# Patient Record
Sex: Female | Born: 1985 | Race: Black or African American | Hispanic: No | Marital: Single | Smoking: Never smoker
Health system: Southern US, Community
[De-identification: ages and names within clinical notes are randomized; demographics above are authoritative.]

## PROBLEM LIST (undated history)

## (undated) ENCOUNTER — Inpatient Hospital Stay: Payer: Self-pay

## (undated) DIAGNOSIS — J45909 Unspecified asthma, uncomplicated: Secondary | ICD-10-CM

## (undated) DIAGNOSIS — Z98891 History of uterine scar from previous surgery: Secondary | ICD-10-CM

## (undated) DIAGNOSIS — E119 Type 2 diabetes mellitus without complications: Secondary | ICD-10-CM

## (undated) DIAGNOSIS — I10 Essential (primary) hypertension: Secondary | ICD-10-CM

## (undated) DIAGNOSIS — K219 Gastro-esophageal reflux disease without esophagitis: Secondary | ICD-10-CM

## (undated) HISTORY — PX: APPENDECTOMY: SHX54

---

## 2005-01-08 ENCOUNTER — Emergency Department: Payer: Self-pay | Admitting: Emergency Medicine

## 2005-10-23 ENCOUNTER — Observation Stay: Payer: Self-pay

## 2005-10-24 ENCOUNTER — Inpatient Hospital Stay: Payer: Self-pay | Admitting: Obstetrics & Gynecology

## 2007-10-03 ENCOUNTER — Emergency Department: Payer: Self-pay | Admitting: Emergency Medicine

## 2008-07-20 ENCOUNTER — Emergency Department: Payer: Self-pay | Admitting: Emergency Medicine

## 2009-11-01 ENCOUNTER — Ambulatory Visit: Payer: Self-pay

## 2010-08-01 ENCOUNTER — Emergency Department: Payer: Self-pay | Admitting: Emergency Medicine

## 2011-11-21 IMAGING — CR DG KNEE COMPLETE 4+V*R*
1 series · 4 of 4 positions shown · non-contrast
Comparison: none

REASON FOR EXAM: fall / pain
COMMENTS:   Bedside (portable):N

PROCEDURE:     DXR - DXR KNEE RT COMP WITH OBLIQUES  - August 01, 2010 [DATE]
RESULT:     No fracture, dislocation or other acute bony abnormality is
identified. The knee joint space is well maintained. The patella is intact.

[Series 1: view not recorded · 0.17mm/px · 4 of 4 slices shown]
[im 1/4]
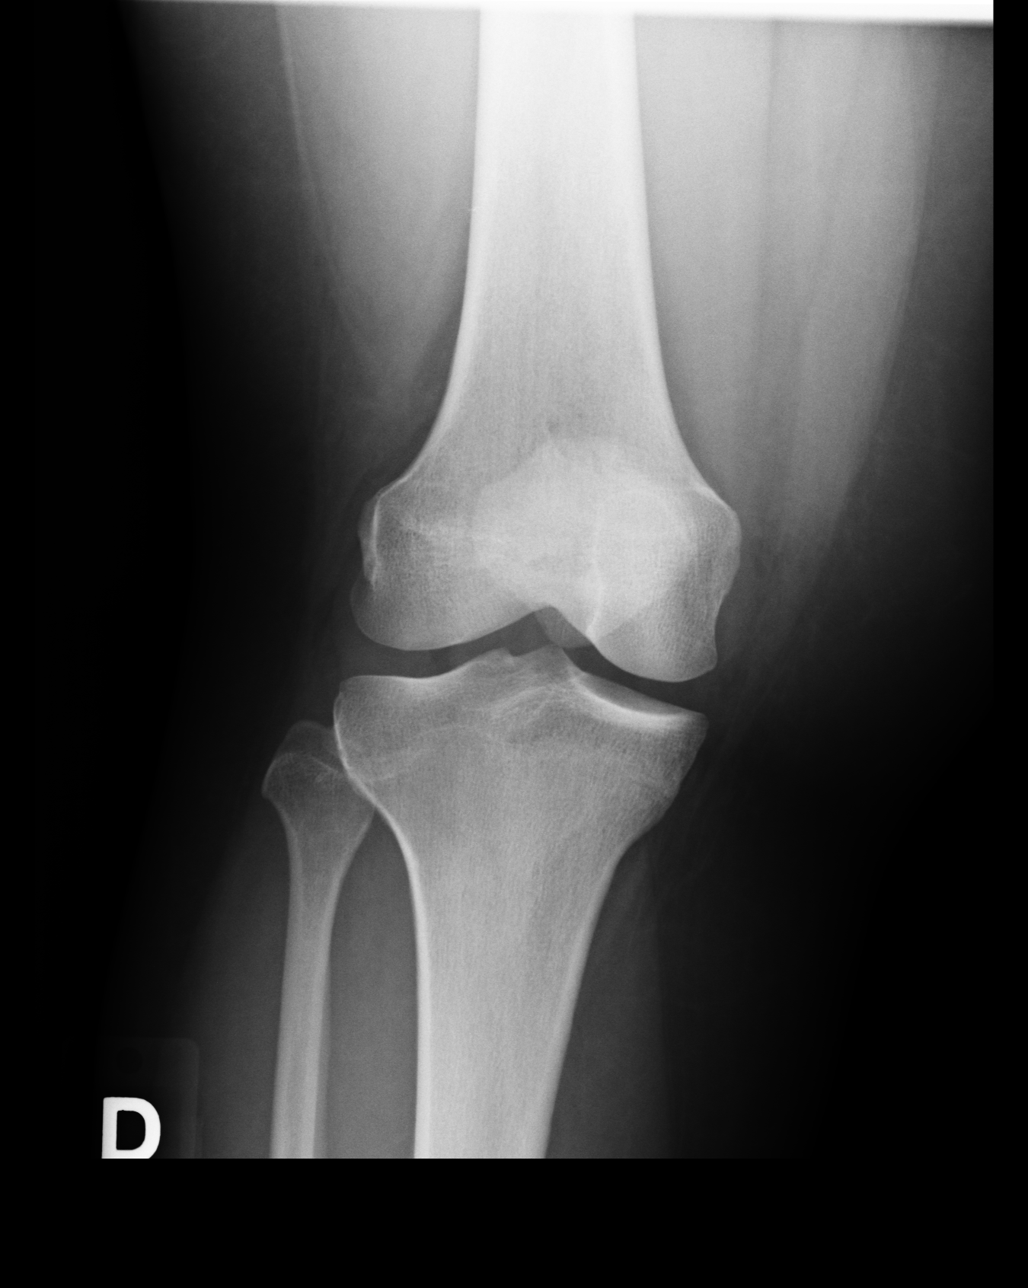
[im 2/4]
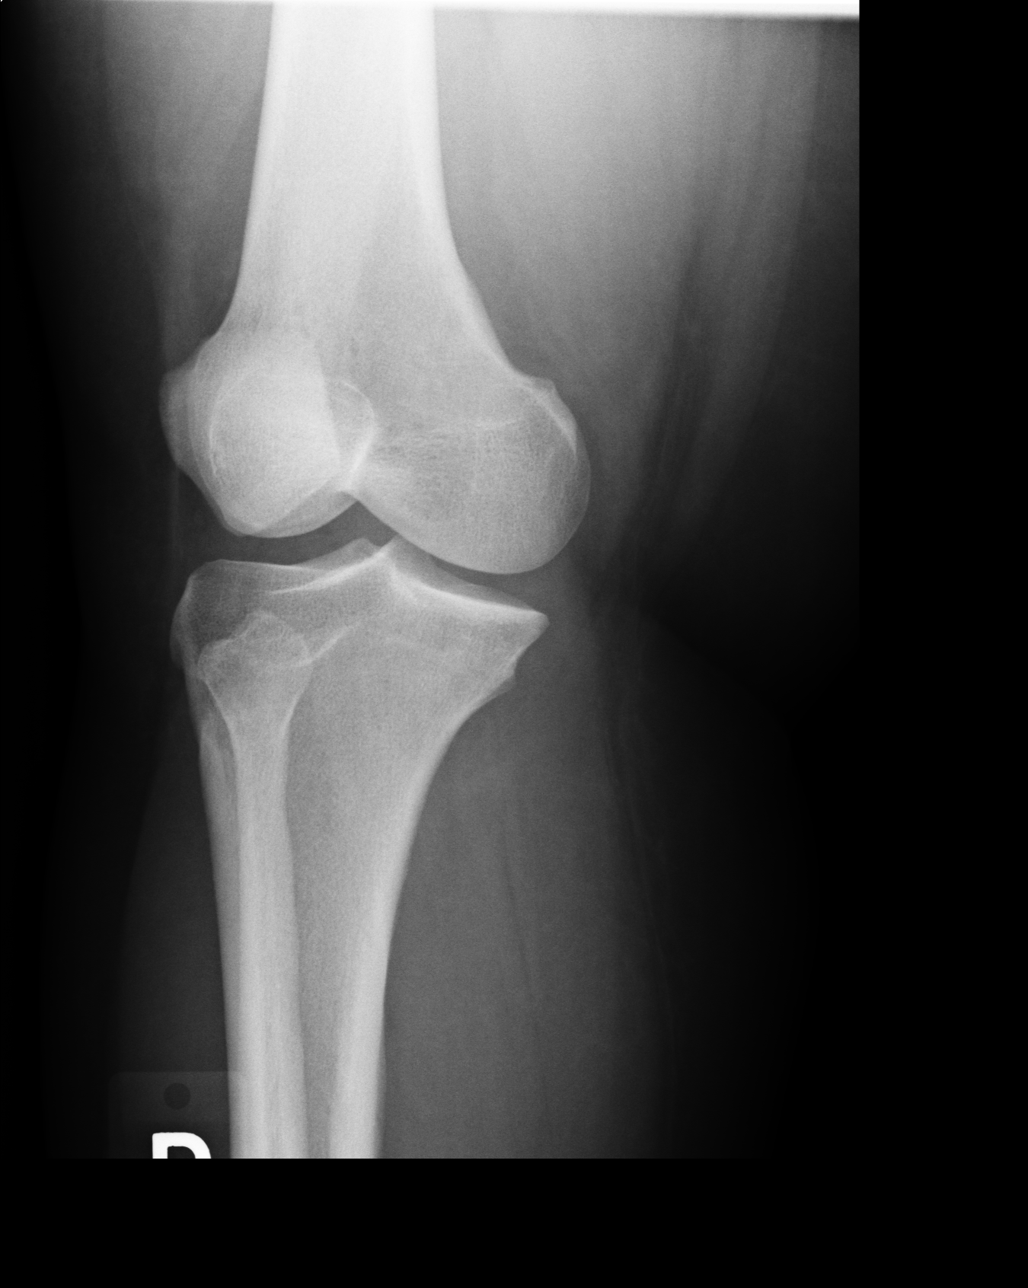
[im 3/4]
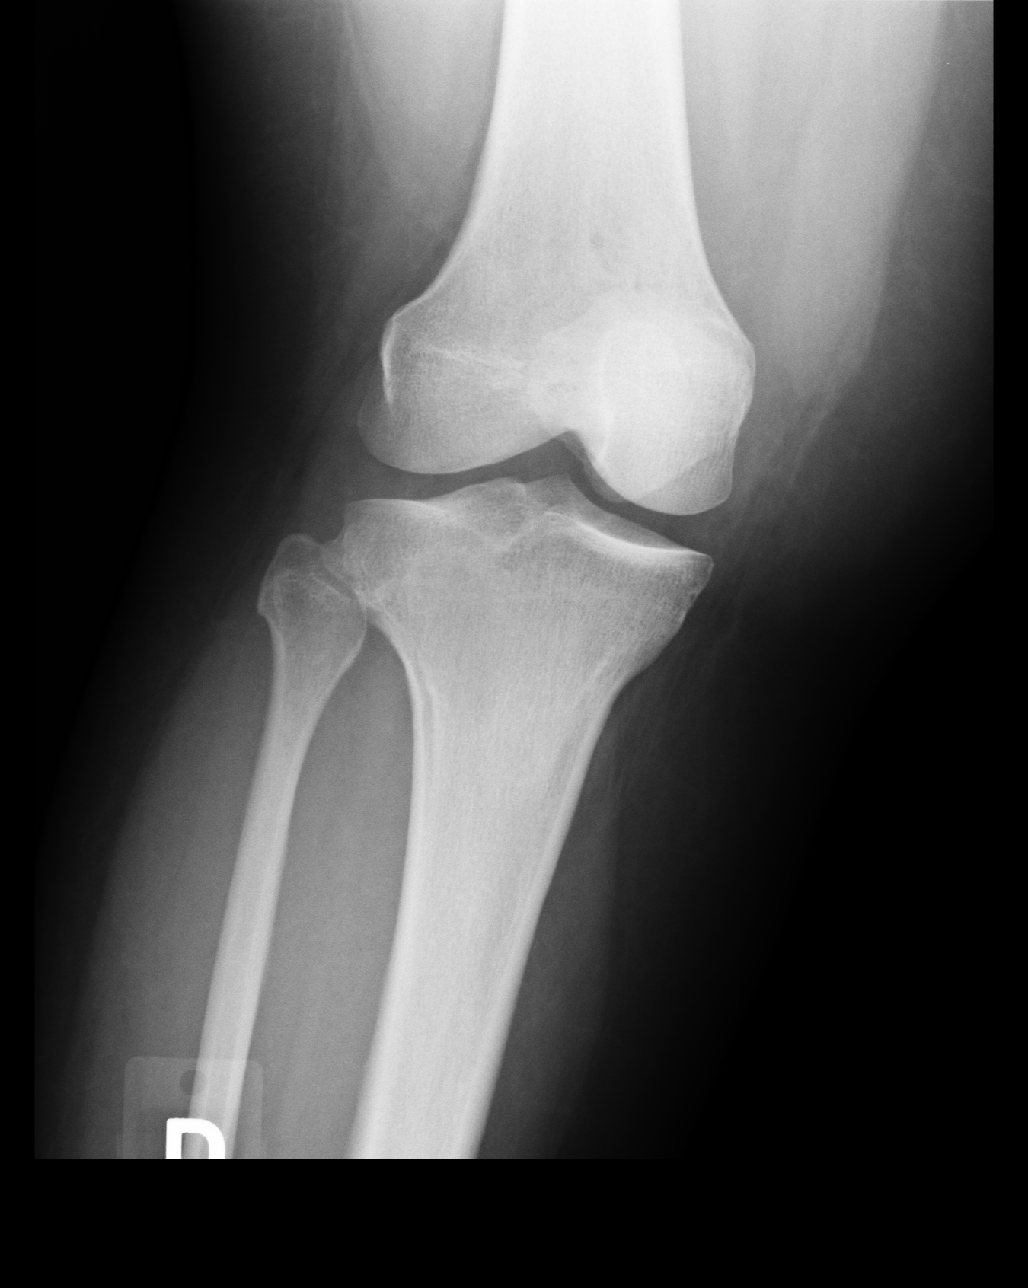
[im 4/4]
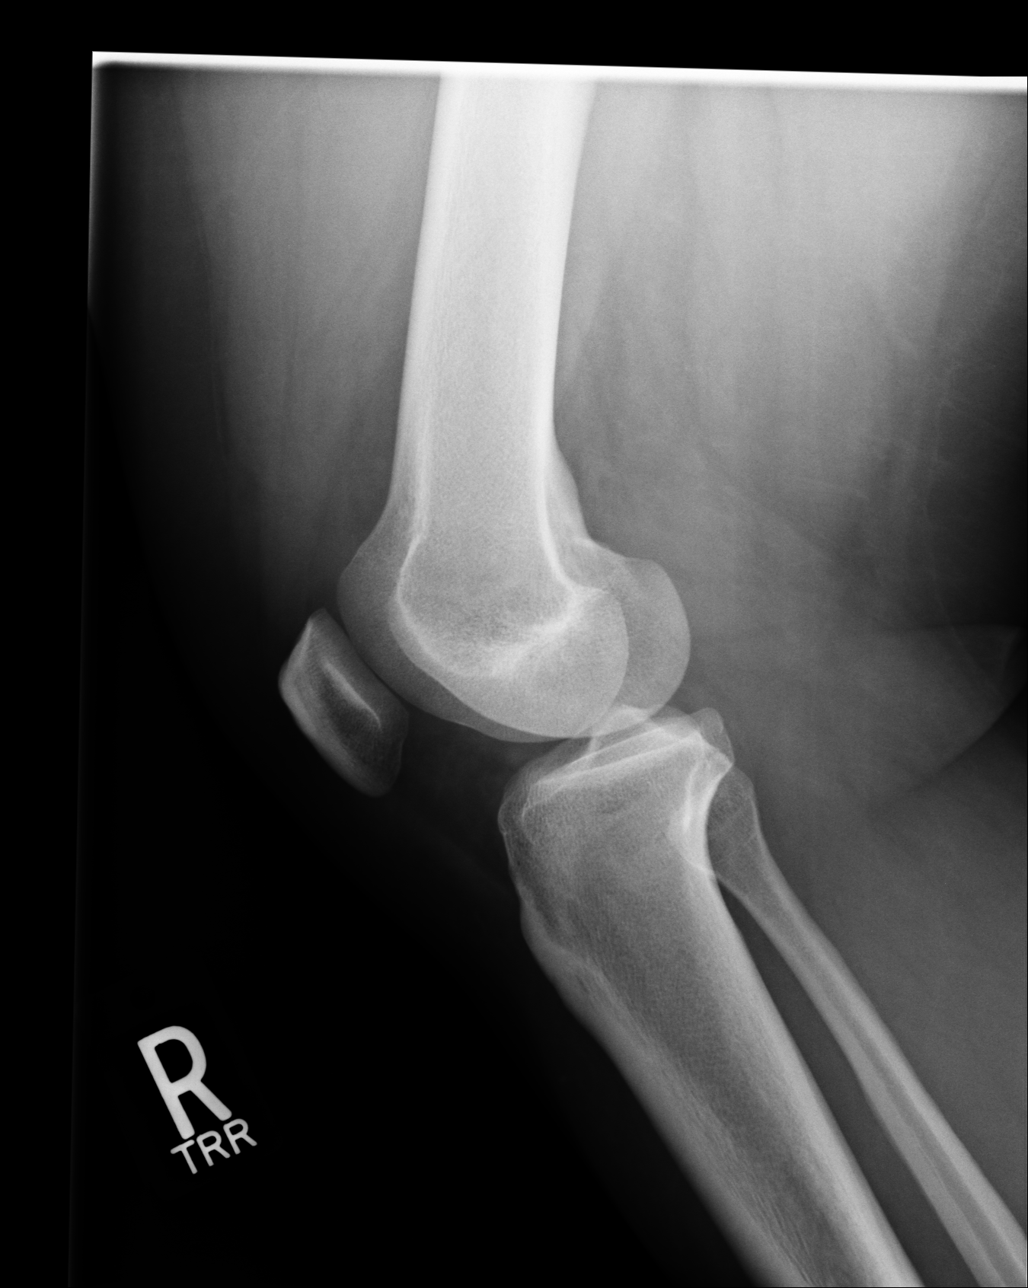

[4 of 4 positions shown; findings below may reference images not displayed]

IMPRESSION: 1.     No acute changes are identified.

## 2013-07-21 ENCOUNTER — Ambulatory Visit: Payer: Self-pay | Admitting: Emergency Medicine

## 2014-11-17 ENCOUNTER — Emergency Department: Payer: Self-pay | Admitting: Emergency Medicine

## 2015-05-24 LAB — OB RESULTS CONSOLE VARICELLA ZOSTER ANTIBODY, IGG: Varicella: IMMUNE

## 2015-05-24 LAB — OB RESULTS CONSOLE HEPATITIS B SURFACE ANTIGEN: HEP B S AG: NEGATIVE

## 2015-05-24 LAB — OB RESULTS CONSOLE RPR: RPR: NONREACTIVE

## 2015-05-24 LAB — OB RESULTS CONSOLE RUBELLA ANTIBODY, IGM: RUBELLA: IMMUNE

## 2015-05-24 LAB — OB RESULTS CONSOLE HIV ANTIBODY (ROUTINE TESTING): HIV: NONREACTIVE

## 2015-12-11 LAB — OB RESULTS CONSOLE GBS: STREP GROUP B AG: NEGATIVE

## 2015-12-12 ENCOUNTER — Observation Stay
Admission: EM | Admit: 2015-12-12 | Discharge: 2015-12-12 | Disposition: A | Discharge status: Home / Self Care | Payer: BC Managed Care – PPO | Attending: Obstetrics & Gynecology | Admitting: Obstetrics & Gynecology

## 2015-12-12 DIAGNOSIS — Z880 Allergy status to penicillin: Secondary | ICD-10-CM | POA: Insufficient documentation

## 2015-12-12 DIAGNOSIS — O34219 Maternal care for unspecified type scar from previous cesarean delivery: Secondary | ICD-10-CM | POA: Diagnosis not present

## 2015-12-12 DIAGNOSIS — O10919 Unspecified pre-existing hypertension complicating pregnancy, unspecified trimester: Secondary | ICD-10-CM | POA: Insufficient documentation

## 2015-12-12 DIAGNOSIS — O479 False labor, unspecified: Secondary | ICD-10-CM | POA: Diagnosis present

## 2015-12-12 DIAGNOSIS — Z98891 History of uterine scar from previous surgery: Secondary | ICD-10-CM

## 2015-12-12 DIAGNOSIS — Z3A00 Weeks of gestation of pregnancy not specified: Secondary | ICD-10-CM | POA: Diagnosis not present

## 2015-12-12 HISTORY — DX: Essential (primary) hypertension: I10

## 2015-12-12 HISTORY — DX: History of uterine scar from previous surgery: Z98.891

## 2015-12-12 MED ORDER — ACETAMINOPHEN 325 MG PO TABS: 650.0000 mg | ORAL_TABLET | ORAL | Status: DC | PRN

## 2015-12-12 MED ORDER — ONDANSETRON HCL 4 MG/2ML IJ SOLN: 4.0000 mg | Freq: Four times a day (QID) | INTRAMUSCULAR | Status: DC | PRN

## 2015-12-12 NOTE — Discharge Instructions (Signed)

## 2015-12-12 NOTE — Final Progress Note (Signed)
Physician Final Progress Note  Patient ID: Julie Patterson MRN: [id number] DOB/AGE: Dec 06, 1985 30 y.o.  Admit date: 12/12/2015 Admitting provider: Gae Dry, MD Discharge date: 12/12/2015   Admission Diagnoses: Low Back Pain  Discharge Diagnoses:  Principal Problem:   History of cesarean delivery    Consults: None  Significant Findings/ Diagnostic Studies: No s/sx PTL  Procedures: A NST procedure was performed with FHR monitoring and a normal baseline established, appropriate time of 20-40 minutes of evaluation, and accels >2 seen w 15x15 characteristics.  Results show a REACTIVE NST.   Discharge Condition: good  Disposition: Home, f/u one week office  Diet: Regular diet  Discharge Activity: Activity as tolerated     Medication List    ASK your doctor about these medications        aspirin 81 MG tablet  Take 81 mg by mouth daily.     labetalol 100 MG tablet  Commonly known as:  NORMODYNE  Take 100 mg by mouth 2 (two) times daily.     prenatal vitamin w/FE, FA 27-1 MG Tabs tablet  Take 1 tablet by mouth daily at 12 noon.         Total time spent taking care of this patient: 15 minutes  Signed: HARRIS,ROBERT PAUL 12/12/2015, 10:40 PM

## 2015-12-12 NOTE — OB Triage Note (Signed)
Patient presented to L&D complaining of contractions that started at 1700 every 7-10 minutes.  Patient denies vaginal bleeding, leaking of fluid or decreased fetal movement, previous C-section delivery.  Patient states she has a history of chronic hypertension and takes labetolol,

## 2015-12-25 ENCOUNTER — Encounter: Payer: Self-pay | Admitting: *Deleted

## 2015-12-25 ENCOUNTER — Inpatient Hospital Stay
Admission: EM | Admit: 2015-12-25 | Discharge: 2015-12-25 | Disposition: A | Discharge status: Home / Self Care | Payer: BC Managed Care – PPO | Attending: Obstetrics and Gynecology | Admitting: Obstetrics and Gynecology

## 2015-12-25 DIAGNOSIS — O368130 Decreased fetal movements, third trimester, not applicable or unspecified: Secondary | ICD-10-CM | POA: Diagnosis present

## 2015-12-25 DIAGNOSIS — Z98891 History of uterine scar from previous surgery: Secondary | ICD-10-CM

## 2015-12-25 DIAGNOSIS — Z3A38 38 weeks gestation of pregnancy: Secondary | ICD-10-CM | POA: Insufficient documentation

## 2015-12-25 NOTE — OB Triage Note (Signed)
Recvd to OBS1 with C/O decreased fetal movement during an office visit today.  To bed and efm applied.  Oriented to room and plan of care discussed.  Verbalized understanding, agrees with POC.

## 2015-12-25 NOTE — Discharge Summary (Signed)
NST note: Pt presented from office for prolonged NST after non-reactive NST at office today.   NST category 1 with baseline130, mod variability, + accels, no decels. Rare ctx  VSS  Discharge home. Pt has [place] scheduled on 2/1 for growth.

## 2016-01-01 ENCOUNTER — Encounter
Admission: RE | Admit: 2016-01-01 | Discharge: 2016-01-01 | Disposition: A | Discharge status: Home / Self Care | Payer: BC Managed Care – PPO | Source: Ambulatory Visit | Attending: Obstetrics and Gynecology | Admitting: Obstetrics and Gynecology

## 2016-01-01 HISTORY — DX: Gastro-esophageal reflux disease without esophagitis: K21.9

## 2016-01-01 HISTORY — DX: Unspecified asthma, uncomplicated: J45.909

## 2016-01-01 HISTORY — DX: Type 2 diabetes mellitus without complications: E11.9

## 2016-01-01 LAB — CBC
HCT: 37.1 % (ref 35.0–47.0)
Hemoglobin: 12.4 g/dL (ref 12.0–16.0)
MCH: 27.4 pg (ref 26.0–34.0)
MCHC: 33.4 g/dL (ref 32.0–36.0)
MCV: 81.9 fL (ref 80.0–100.0)
PLATELETS: 226 10*3/uL (ref 150–440)
RBC: 4.53 MIL/uL (ref 3.80–5.20)
RDW: 14.1 % (ref 11.5–14.5)
WBC: 7.7 10*3/uL (ref 3.6–11.0)

## 2016-01-01 LAB — TYPE AND SCREEN
ABO/RH(D): O POS
Antibody Screen: NEGATIVE
Extend sample reason: UNDETERMINED

## 2016-01-01 LAB — RAPID HIV SCREEN (HIV 1/2 AB+AG)
HIV 1/2 Antibodies: NONREACTIVE
HIV-1 P24 ANTIGEN - HIV24: NONREACTIVE

## 2016-01-02 ENCOUNTER — Inpatient Hospital Stay
Admission: RE | Admit: 2016-01-02 | Discharge: 2016-01-04 | DRG: 765 | Disposition: A | Discharge status: Home / Self Care | Payer: BC Managed Care – PPO | Source: Ambulatory Visit | Attending: Obstetrics and Gynecology | Admitting: Obstetrics and Gynecology

## 2016-01-02 ENCOUNTER — Inpatient Hospital Stay: Payer: BC Managed Care – PPO | Admitting: Anesthesiology

## 2016-01-02 ENCOUNTER — Encounter
Admission: RE | Disposition: A | Discharge status: Home / Self Care | Payer: Self-pay | Source: Ambulatory Visit | Attending: Obstetrics and Gynecology

## 2016-01-02 ENCOUNTER — Inpatient Hospital Stay
Admission: RE | Admit: 2016-01-02 | Payer: BC Managed Care – PPO | Source: Ambulatory Visit | Admitting: Obstetrics and Gynecology

## 2016-01-02 DIAGNOSIS — O10913 Unspecified pre-existing hypertension complicating pregnancy, third trimester: Secondary | ICD-10-CM | POA: Diagnosis present

## 2016-01-02 DIAGNOSIS — Z98891 History of uterine scar from previous surgery: Secondary | ICD-10-CM

## 2016-01-02 DIAGNOSIS — O24420 Gestational diabetes mellitus in childbirth, diet controlled: Secondary | ICD-10-CM | POA: Diagnosis present

## 2016-01-02 DIAGNOSIS — O1002 Pre-existing essential hypertension complicating childbirth: Secondary | ICD-10-CM | POA: Diagnosis present

## 2016-01-02 DIAGNOSIS — O34211 Maternal care for low transverse scar from previous cesarean delivery: Principal | ICD-10-CM | POA: Diagnosis present

## 2016-01-02 DIAGNOSIS — O99214 Obesity complicating childbirth: Secondary | ICD-10-CM | POA: Diagnosis present

## 2016-01-02 DIAGNOSIS — K660 Peritoneal adhesions (postprocedural) (postinfection): Secondary | ICD-10-CM | POA: Diagnosis present

## 2016-01-02 DIAGNOSIS — E669 Obesity, unspecified: Secondary | ICD-10-CM | POA: Diagnosis present

## 2016-01-02 DIAGNOSIS — O99213 Obesity complicating pregnancy, third trimester: Secondary | ICD-10-CM | POA: Diagnosis present

## 2016-01-02 DIAGNOSIS — Z6841 Body Mass Index (BMI) 40.0 and over, adult: Secondary | ICD-10-CM | POA: Diagnosis not present

## 2016-01-02 DIAGNOSIS — Z3A39 39 weeks gestation of pregnancy: Secondary | ICD-10-CM

## 2016-01-02 HISTORY — DX: History of uterine scar from previous surgery: Z98.891

## 2016-01-02 LAB — RPR: RPR Ser Ql: NONREACTIVE

## 2016-01-02 LAB — ABO/RH: ABO/RH(D): O POS

## 2016-01-02 SURGERY — Surgical Case
Anesthesia: Spinal

## 2016-01-02 MED ORDER — NALBUPHINE HCL 10 MG/ML IJ SOLN: 5.0000 mg | INTRAMUSCULAR | Status: DC | PRN

## 2016-01-02 MED ORDER — DIPHENHYDRAMINE HCL 25 MG PO CAPS
25.0000 mg | ORAL_CAPSULE | ORAL | Status: DC | PRN
Start: 2016-01-02 — End: 2016-01-04
  Administered 2016-01-02 – 2016-01-03 (×2): 25 mg via ORAL
  Filled 2016-01-02 (×2): qty 1

## 2016-01-02 MED ORDER — DIBUCAINE 1 % RE OINT: 1.0000 "application " | TOPICAL_OINTMENT | RECTAL | Status: DC | PRN

## 2016-01-02 MED ORDER — DIPHENHYDRAMINE HCL 25 MG PO CAPS: 25.0000 mg | ORAL_CAPSULE | Freq: Four times a day (QID) | ORAL | Status: DC | PRN

## 2016-01-02 MED ORDER — DIPHENHYDRAMINE HCL 50 MG/ML IJ SOLN
12.5000 mg | INTRAMUSCULAR | Status: DC | PRN
  Administered 2016-01-02: 12.5 mg via INTRAVENOUS
  Filled 2016-01-02: qty 1

## 2016-01-02 MED ORDER — LACTATED RINGERS IV SOLN
INTRAVENOUS | Status: DC
  Administered 2016-01-02: 09:00:00 via INTRAVENOUS

## 2016-01-02 MED ORDER — GENTAMICIN SULFATE 40 MG/ML IJ SOLN
140.8500 mg | INTRAMUSCULAR | Status: DC | PRN
  Administered 2016-01-02: 140.8 mg via INTRAVENOUS

## 2016-01-02 MED ORDER — IBUPROFEN 600 MG PO TABS
600.0000 mg | ORAL_TABLET | Freq: Four times a day (QID) | ORAL | Status: DC
  Administered 2016-01-02 – 2016-01-04 (×6): 600 mg via ORAL
  Filled 2016-01-02 (×4): qty 1

## 2016-01-02 MED ORDER — SIMETHICONE 80 MG PO CHEW
80.0000 mg | CHEWABLE_TABLET | Freq: Three times a day (TID) | ORAL | Status: DC
  Administered 2016-01-03 (×3): 80 mg via ORAL
  Filled 2016-01-02 (×3): qty 1

## 2016-01-02 MED ORDER — DEXTROSE 5 % IV SOLN
1.0000 ug/kg/h | INTRAVENOUS | Status: DC | PRN
  Filled 2016-01-02: qty 2

## 2016-01-02 MED ORDER — ONDANSETRON HCL 4 MG/2ML IJ SOLN: 4.0000 mg | Freq: Three times a day (TID) | INTRAMUSCULAR | Status: DC | PRN

## 2016-01-02 MED ORDER — LACTATED RINGERS IV SOLN
2.5000 [IU]/h | INTRAVENOUS | Status: AC
  Filled 2016-01-02: qty 4

## 2016-01-02 MED ORDER — FERROUS SULFATE 325 (65 FE) MG PO TABS
325.0000 mg | ORAL_TABLET | Freq: Two times a day (BID) | ORAL | Status: DC
  Administered 2016-01-03 – 2016-01-04 (×3): 325 mg via ORAL
  Filled 2016-01-02 (×3): qty 1

## 2016-01-02 MED ORDER — LANOLIN HYDROUS EX OINT: 1.0000 "application " | TOPICAL_OINTMENT | CUTANEOUS | Status: DC | PRN

## 2016-01-02 MED ORDER — ONDANSETRON HCL 4 MG/2ML IJ SOLN
4.0000 mg | Freq: Once | INTRAMUSCULAR | Status: DC | PRN
Start: 2016-01-02 — End: 2016-01-02

## 2016-01-02 MED ORDER — LABETALOL HCL 100 MG PO TABS
100.0000 mg | ORAL_TABLET | Freq: Two times a day (BID) | ORAL | Status: DC
  Administered 2016-01-03 – 2016-01-04 (×2): 100 mg via ORAL
  Filled 2016-01-02 (×2): qty 1

## 2016-01-02 MED ORDER — CITRIC ACID-SODIUM CITRATE 334-500 MG/5ML PO SOLN
ORAL | Status: AC
  Administered 2016-01-02: 30 mL
  Filled 2016-01-02: qty 15

## 2016-01-02 MED ORDER — PRENATAL MULTIVITAMIN CH
1.0000 | ORAL_TABLET | Freq: Every day | ORAL | Status: DC
  Administered 2016-01-03: 1 via ORAL
  Filled 2016-01-02: qty 1

## 2016-01-02 MED ORDER — BUPIVACAINE HCL (PF) 0.5 % IJ SOLN: 5.0000 mL | Freq: Once | INTRAMUSCULAR | Status: DC

## 2016-01-02 MED ORDER — SCOPOLAMINE 1 MG/3DAYS TD PT72
MEDICATED_PATCH | TRANSDERMAL | Status: AC
  Filled 2016-01-02: qty 1

## 2016-01-02 MED ORDER — CLINDAMYCIN PHOSPHATE 900 MG/50ML IV SOLN
900.0000 mg | INTRAVENOUS | Status: AC
  Administered 2016-01-02: 900 mg via INTRAVENOUS
  Filled 2016-01-02: qty 50

## 2016-01-02 MED ORDER — FENTANYL CITRATE (PF) 100 MCG/2ML IJ SOLN
25.0000 ug | INTRAMUSCULAR | Status: DC | PRN
  Administered 2016-01-02 (×2): 25 ug via INTRAVENOUS
  Filled 2016-01-02: qty 2

## 2016-01-02 MED ORDER — SODIUM CHLORIDE 0.9% FLUSH: 3.0000 mL | INTRAVENOUS | Status: DC | PRN

## 2016-01-02 MED ORDER — BUPIVACAINE IN DEXTROSE 0.75-8.25 % IT SOLN
INTRATHECAL | Status: DC | PRN
  Administered 2016-01-02: 1.5 mL via INTRATHECAL

## 2016-01-02 MED ORDER — NALBUPHINE HCL 10 MG/ML IJ SOLN: 5.0000 mg | Freq: Once | INTRAMUSCULAR | Status: AC | PRN

## 2016-01-02 MED ORDER — LACTATED RINGERS IV SOLN
INTRAVENOUS | Status: DC
  Administered 2016-01-02: 12:00:00 via INTRAVENOUS

## 2016-01-02 MED ORDER — MENTHOL 3 MG MT LOZG
1.0000 | LOZENGE | OROMUCOSAL | Status: DC | PRN
Start: 2016-01-02 — End: 2016-01-04

## 2016-01-02 MED ORDER — OXYTOCIN 10 UNIT/ML IJ SOLN
40.0000 [IU] | INTRAVENOUS | Status: DC | PRN
  Administered 2016-01-02: 40 [IU] via INTRAVENOUS

## 2016-01-02 MED ORDER — SCOPOLAMINE 1 MG/3DAYS TD PT72
1.0000 | MEDICATED_PATCH | Freq: Once | TRANSDERMAL | Status: DC
  Administered 2016-01-02: 1.5 mg via TRANSDERMAL

## 2016-01-02 MED ORDER — IBUPROFEN 600 MG PO TABS
600.0000 mg | ORAL_TABLET | Freq: Four times a day (QID) | ORAL | Status: DC | PRN
  Administered 2016-01-02: 600 mg via ORAL
  Filled 2016-01-02 (×3): qty 1

## 2016-01-02 MED ORDER — NALBUPHINE HCL 10 MG/ML IJ SOLN
5.0000 mg | Freq: Once | INTRAMUSCULAR | Status: AC | PRN
  Administered 2016-01-02: 5 mg via INTRAVENOUS
  Filled 2016-01-02: qty 1

## 2016-01-02 MED ORDER — SENNOSIDES-DOCUSATE SODIUM 8.6-50 MG PO TABS
2.0000 | ORAL_TABLET | ORAL | Status: DC
  Administered 2016-01-02 – 2016-01-03 (×2): 2 via ORAL
  Filled 2016-01-02 (×2): qty 2

## 2016-01-02 MED ORDER — NALOXONE HCL 0.4 MG/ML IJ SOLN: 0.4000 mg | INTRAMUSCULAR | Status: DC | PRN

## 2016-01-02 MED ORDER — MORPHINE SULFATE (PF) 0.5 MG/ML IJ SOLN
INTRAMUSCULAR | Status: DC | PRN
  Administered 2016-01-02: .2 mg via EPIDURAL

## 2016-01-02 MED ORDER — LACTATED RINGERS IV SOLN
Freq: Once | INTRAVENOUS | Status: AC
  Administered 2016-01-02: 08:00:00 via INTRAVENOUS

## 2016-01-02 MED ORDER — BUPIVACAINE HCL (PF) 0.5 % IJ SOLN
5.0000 mL | Freq: Once | INTRAMUSCULAR | Status: DC
  Filled 2016-01-02: qty 30

## 2016-01-02 MED ORDER — WITCH HAZEL-GLYCERIN EX PADS: 1.0000 "application " | MEDICATED_PAD | CUTANEOUS | Status: DC | PRN

## 2016-01-02 MED ORDER — PHENYLEPHRINE HCL 10 MG/ML IJ SOLN
INTRAMUSCULAR | Status: DC | PRN
  Administered 2016-01-02 (×5): 100 ug via INTRAVENOUS

## 2016-01-02 MED ORDER — DEXTROSE 5 % IV SOLN
140.0000 mg | INTRAVENOUS | Status: DC
  Filled 2016-01-02: qty 3.5

## 2016-01-02 MED ORDER — BUPIVACAINE 0.25 % ON-Q PUMP DUAL CATH 400 ML
400.0000 mL | INJECTION | Status: DC
  Filled 2016-01-02: qty 400

## 2016-01-02 MED ORDER — BUPIVACAINE ON-Q PAIN PUMP (FOR ORDER SET NO CHG)
INJECTION | Status: DC
  Filled 2016-01-02: qty 1

## 2016-01-02 MED ORDER — ONDANSETRON HCL 4 MG/2ML IJ SOLN
INTRAMUSCULAR | Status: DC | PRN
  Administered 2016-01-02: 4 mg via INTRAVENOUS

## 2016-01-02 MED ORDER — MEPERIDINE HCL 25 MG/ML IJ SOLN: 6.2500 mg | INTRAMUSCULAR | Status: DC | PRN

## 2016-01-02 SURGICAL SUPPLY — 30 items
CANISTER SUCT 3000ML (MISCELLANEOUS) ×3 IMPLANT
CATH KIT ON-Q SILVERSOAK 5IN (CATHETERS) ×6 IMPLANT
CLOSURE WOUND 1/2 X4 (GAUZE/BANDAGES/DRESSINGS) ×2
DRSG OPSITE POSTOP 4X8 (GAUZE/BANDAGES/DRESSINGS) ×3 IMPLANT
DRSG TELFA 3X8 NADH (GAUZE/BANDAGES/DRESSINGS) ×3 IMPLANT
ELECT CAUTERY BLADE 6.4 (BLADE) IMPLANT
ELECT REM PT RETURN 9FT ADLT (ELECTROSURGICAL) ×3
ELECTRODE REM PT RTRN 9FT ADLT (ELECTROSURGICAL) ×1 IMPLANT
GAUZE SPONGE 4X4 12PLY STRL (GAUZE/BANDAGES/DRESSINGS) ×6 IMPLANT
GLOVE BIO SURGEON STRL SZ7 (GLOVE) ×3 IMPLANT
GLOVE INDICATOR 7.5 STRL GRN (GLOVE) ×3 IMPLANT
GOWN STRL REUS W/ TWL LRG LVL3 (GOWN DISPOSABLE) ×3 IMPLANT
GOWN STRL REUS W/TWL LRG LVL3 (GOWN DISPOSABLE) ×6
LIQUID BAND (GAUZE/BANDAGES/DRESSINGS) ×3 IMPLANT
NS IRRIG 1000ML POUR BTL (IV SOLUTION) ×3 IMPLANT
PACK C SECTION AR (MISCELLANEOUS) ×3 IMPLANT
PAD ABD DERMACEA PRESS 5X9 (GAUZE/BANDAGES/DRESSINGS) ×3 IMPLANT
PAD OB MATERNITY 4.3X12.25 (PERSONAL CARE ITEMS) ×6 IMPLANT
PAD PREP 24X41 OB/GYN DISP (PERSONAL CARE ITEMS) ×3 IMPLANT
SPONGE LAP 18X18 5 PK (GAUZE/BANDAGES/DRESSINGS) ×9 IMPLANT
STRIP CLOSURE SKIN 1/2X4 (GAUZE/BANDAGES/DRESSINGS) ×4 IMPLANT
SUT CHROMIC GUT BROWN 0 54 (SUTURE) ×1 IMPLANT
SUT CHROMIC GUT BROWN 0 54IN (SUTURE) ×3
SUT MNCRL 4-0 (SUTURE) ×2
SUT MNCRL 4-0 27XMFL (SUTURE) ×1
SUT PDS AB 1 TP1 96 (SUTURE) ×3 IMPLANT
SUT PLAIN 2 0 XLH (SUTURE) ×6 IMPLANT
SUT VIC AB 0 CT1 36 (SUTURE) ×12 IMPLANT
SUTURE MNCRL 4-0 27XMF (SUTURE) ×1 IMPLANT
SWABSTK COMLB BENZOIN TINCTURE (MISCELLANEOUS) ×3 IMPLANT

## 2016-01-02 NOTE — Discharge Summary (Signed)
OB Discharge Summary  Patient Name: Julie Patterson DOB: 09-09-1986 MRN: [id number]  Date of admission: 01/02/2016 Delivering MD: Prentice Docker, MD Date of Delivery: 01/02/2016  Date of discharge: 01/04/2016  Admitting diagnosis:  Patient Active Problem List   Diagnosis Date Noted  . Chronic hypertension in obstetric context in third trimester 01/02/2016  . Obesity affecting pregnancy in third trimester, antepartum 01/02/2016  . BMI 40.0-44.9, adult ([place]) 01/02/2016  . [redacted] weeks gestation of pregnancy 01/02/2016  . Status post cesarean section 01/02/2016  . History of cesarean delivery 12/12/2015  Supervision of high risk pregnancy  Intrauterine pregnancy: [redacted]w[redacted]d      Secondary diagnosis: Chronic Hypertension     Discharge diagnosis: Term Pregnancy Delivered and CHTN                                                                                                Post partum procedures:none  Augmentation: n/a  Complications: None  Hospital course:  Sceduled C/S   30 y.o. yo G2P2001 at [redacted]w[redacted]d was admitted to the hospital 01/02/2016 for scheduled cesarean section with the following indication:Elective Repeat.  Membrane Rupture Time/Date:   ,    Patient delivered a Viable infant.01/02/2016  Details of operation can be found in separate operative note.  Pateint had an uncomplicated postpartum course.  She is ambulating, tolerating a regular diet, passing flatus, and urinating well. Patient is discharged home in stable condition on  01/04/2016          Physical exam  Filed Vitals:   01/03/16 1420 01/03/16 1659 01/03/16 1927 01/04/16 0700  BP: 122/53 120/62 124/46 126/66  Pulse: 62 60 55 65  Temp:  98.5 F (36.9 C) 98.4 F (36.9 C) 98.7 F (37.1 C)  TempSrc:  Oral Oral Oral  Resp:  16 20 18   Height:      Weight:      SpO2:       General: alert, cooperative and no distress Lochia: appropriate Uterine Fundus: firm Incision: Healing well with no significant drainage DVT  Evaluation: No evidence of DVT seen on physical exam.  Labs: Lab Results  Component Value Date   WBC 13.1* 01/03/2016   HGB 10.9* 01/03/2016   HCT 32.6* 01/03/2016   MCV 82.4 01/03/2016   PLT 209 01/03/2016   No flowsheet data found.  Discharge instruction: per After Visit Summary.  Medications:    Medication List    TAKE these medications        aspirin 81 MG tablet  Take 81 mg by mouth daily.     labetalol 100 MG tablet  Commonly known as:  NORMODYNE  Take 100 mg by mouth 2 (two) times daily.     oxyCODONE-acetaminophen 5-325 MG tablet  Commonly known as:  PERCOCET/ROXICET  Take 1-2 tablets by mouth every 4 (four) hours as needed for severe pain.     prenatal vitamin w/FE, FA 27-1 MG Tabs tablet  Take 1 tablet by mouth daily at 12 noon.      ALSO-    Colace for stool softener for 2 weeks    Ibuprofen for pain  Diet: routine diet  Activity: Advance as tolerated. Pelvic rest for 6 weeks.   Outpatient follow up: Wednesday with Dr Glennon Mac at [facility]  Postpartum contraception: Combination OCPs Rhogam Given postpartum: no Rubella vaccine given postpartum: no Varicella vaccine given postpartum: no TDaP given antepartum or postpartum: TDaP given 10/30/15 Flu vaccine given antepartum or postpartum: given AP on 10/30/15  Newborn Data: Live born female  Birth Weight: 6 lb 14.1 oz (3120 g) APGAR: 9, 9  Baby Feeding: Bottle  Disposition:home with mother  SIGNED: Barnett Applebaum, MD

## 2016-01-02 NOTE — Transfer of Care (Signed)
Immediate Anesthesia Transfer of Care Note  Patient: Julie Patterson  Procedure(s) Performed: Procedure(s): CESAREAN SECTION (N/A)  Patient Location: Mother/Baby  Anesthesia Type:Spinal  Level of Consciousness: awake, alert , oriented and patient cooperative  Airway & Oxygen Therapy: Patient Spontanous Breathing  Post-op Assessment: Report given to RN, Post -op Vital signs reviewed and stable and Patient moving all extremities X 4  Post vital signs: Reviewed and stable  Last Vitals:  Filed Vitals:   01/02/16 0800 01/02/16 1137  BP: 130/69 94/47  Pulse: 92 61  Temp: 36.8 C 36.7 C  Resp: 16 15    Complications: No apparent anesthesia complications

## 2016-01-02 NOTE — Anesthesia Procedure Notes (Addendum)
Spinal Patient location during procedure: OR Start time: 01/02/2016 9:46 AM End time: 01/02/2016 9:58 AM Staffing Anesthesiologist: Katy Fitch K Performed by: anesthesiologist  Preanesthetic Checklist Completed: patient identified, site marked, surgical consent, pre-op evaluation, timeout performed, IV checked, risks and benefits discussed and monitors and equipment checked Spinal Block Patient position: sitting Prep: Betadine Patient monitoring: heart rate, continuous pulse ox, blood pressure and cardiac monitor Approach: midline Location: L4-5 Injection technique: single-shot Needle Needle type: Whitacre and Introducer  Needle gauge: 24 G Needle length: 9 cm Needle insertion depth: 8 cm Additional Notes Negative paresthesia. Negative blood return. Positive free-flowing CSF. Expiration date of kit checked and confirmed. Patient tolerated procedure well, without complications.    Date/Time: 01/02/2016 10:00 AM Performed by: Allean Found Pre-anesthesia Checklist: Patient identified, Emergency Drugs available, Suction available, Patient being monitored and Timeout performed Oxygen Delivery Method: Nasal cannula

## 2016-01-02 NOTE — Op Note (Signed)
Cesarean Section Procedure Note   Julie Patterson   01/02/2016   Pre-operative Diagnosis:  1) intrauterine pregnancy at [redacted]w[redacted]d  2) history of cesarean delivery, desires repeat  Post-operative Diagnosis:  1) intrauterine pregnancy at [redacted]w[redacted]d  2) history of cesarean delivery, desires repeat  Procedure: Repeat low transverse cesarean section via pfannenstiel incision with double layer uterine closure  Surgeon: Surgeon(s) and Role:    * Will Bonnet, MD - Primary    * Aletha Halim, MD - Assisting   Assistants: Franchot Heidelberg, PA-S  Anesthesia: spinal   Findings:  1) normal appearing gravid uterus, fallopian tubes, and ovaries 2) Dense adhesions of left anterior abdominal wall to left anterior and lateral uterus   Estimated Blood Loss: 1,000 mL  Total IV Fluids: 1,800 ml crystalloid  Urine Output: 200 mL clear urine at end of procedure  Specimens: None  Complications: no complications  Disposition: PACU - hemodynamically stable.   Maternal Condition: stable   Baby condition / location:  Couplet care / Skin to Skin  Procedure Details:  The patient was seen in the Holding Room. The risks, benefits, complications, treatment options, and expected outcomes were discussed with the patient. The patient concurred with the proposed plan, giving informed consent. identified as Julie Patterson and the procedure verified as C-Section Delivery. A Time Out was held and the above information confirmed.   After induction of anesthesia, the patient was draped and prepped in the usual sterile manner. A Pfannenstiel incision was made and carried down through the subcutaneous tissue to the fascia. Fascial incision was made and extended transversely. The fascia was separated from the underlying rectus tissue superiorly and inferiorly. The peritoneum was identified and entered. Peritoneal incision was extended longitudinally. Dense adhesion were noted along the left uterus to left anterior  abdominal wall.  A portion of this was lysed using electrocautery with suture application for hemostasis.  The bladder flap was not bluntly freed from the lower uterine segment. A low transverse uterine incision was made and the hysterotomy was extended with cranial-caudal tension. Delivered from cephalic presentation was a 3,120 gram Living newborn infant(s) or Female with Apgar scores of 9 at one minute and 9 at five minutes. Cord ph was not sent the umbilical cord was clamped and cut cord blood was obtained for evaluation. The placenta was removed Intact and appeared normal. The uterine outline, tubes and ovaries appeared normal. The uterine incision was closed with running locked sutures of 0 Vicryl.  A second layer of the same suture was thrown in an imbricating fashion.  Hemostasis was assured.  The uterus was retained to the abdomen and the paracolic gutters were cleared of all clots and debris.  The rectus muscles were inspected and found to be hemostatic.  The fascia was then reapproximated with running sutures of 1-0 PDS, looped. The subcutaneous tissue was reapproximated using 2-0 plain gut such that no greater than 2cm of dead space remained. The subcuticular closure was performed using 4-0 monocryl. The skin closure was reinforced using benzoin and 1/2" steri-strips.  Instrument, sponge, and needle counts were correct prior the abdominal closure and were correct at the conclusion of the case.  The patient received clindamycin 900 mg IV and gentamicin 140mg  IV prior to skin incision (within 30 minutes) for antibiotic prophylaxis. For VTE prophylaxis she was wearing SCDs throughout the case.   Signed: Will Bonnet, MD 01/02/2016 11:24 AM

## 2016-01-02 NOTE — Consult Note (Signed)
Neonatology Note:   Attendance at C-section:    I was asked by Dr. Glennon Mac to attend this repeat C/S at term. The mother is a G2P1 O pos with normal prenatal labs with chronic HTN, on labetalol, and diet-controlled GDM. ROM at delivery, fluid clear. Infant vigorous with good spontaneous cry and tone. Needed only minimal bulb suctioning. Ap 9/9. Lungs clear to ausc in DR. To CN to care of Pediatrician.   Real Cons, MD

## 2016-01-02 NOTE — H&P (Signed)
History and Physical Interval Note:  Julie Patterson  has presented today for surgery, with the diagnosis of prior c section  The various methods of treatment have been discussed with the patient and family. After consideration of risks, benefits and other options for treatment, the patient has consented to  Procedure(s): CESAREAN SECTION (N/A) as a surgical intervention .  The patient's history has been reviewed, patient examined, no change in status, stable for surgery.  I have reviewed the patient's chart and labs.  Questions were answered to the patient's satisfaction.    The patient does not take a beta blocker and one is not indicated for this surgery.  Will Bonnet, MD 01/02/2016 9:41 AM

## 2016-01-02 NOTE — Anesthesia Preprocedure Evaluation (Signed)
Anesthesia Evaluation  Patient identified by MRN, date of birth, ID band Patient awake    Reviewed: Allergy & Precautions, NPO status , Patient's Chart, lab work & pertinent test results  History of Anesthesia Complications Negative for: history of anesthetic complications  Airway Mallampati: III       Dental  (+) Teeth Intact   Pulmonary neg pulmonary ROS, asthma (as a child) ,           Cardiovascular hypertension, Pt. on medications      Neuro/Psych negative neurological ROS     GI/Hepatic Neg liver ROS, GERD  Medicated and Poorly Controlled,  Endo/Other  diabetes (borderline)  Renal/GU negative Renal ROS     Musculoskeletal   Abdominal   Peds  Hematology   Anesthesia Other Findings   Reproductive/Obstetrics                             Anesthesia Physical Anesthesia Plan  ASA: II  Anesthesia Plan: Spinal   Post-op Pain Management:    Induction:   Airway Management Planned:   Additional Equipment:   Intra-op Plan:   Post-operative Plan:   Informed Consent: I have reviewed the patients History and Physical, chart, labs and discussed the procedure including the risks, benefits and alternatives for the proposed anesthesia with the patient or authorized representative who has indicated his/her understanding and acceptance.     Plan Discussed with:   Anesthesia Plan Comments:         Anesthesia Quick Evaluation

## 2016-01-03 LAB — CBC
HEMATOCRIT: 32.6 % — AB (ref 35.0–47.0)
HEMOGLOBIN: 10.9 g/dL — AB (ref 12.0–16.0)
MCH: 27.6 pg (ref 26.0–34.0)
MCHC: 33.5 g/dL (ref 32.0–36.0)
MCV: 82.4 fL (ref 80.0–100.0)
Platelets: 209 10*3/uL (ref 150–440)
RBC: 3.96 MIL/uL (ref 3.80–5.20)
RDW: 14.1 % (ref 11.5–14.5)
WBC: 13.1 10*3/uL — ABNORMAL HIGH (ref 3.6–11.0)

## 2016-01-03 MED ORDER — OXYCODONE-ACETAMINOPHEN 5-325 MG PO TABS
1.0000 | ORAL_TABLET | ORAL | Status: DC | PRN
  Administered 2016-01-03: 1 via ORAL
  Filled 2016-01-03: qty 1

## 2016-01-03 NOTE — Anesthesia Postprocedure Evaluation (Signed)
Anesthesia Post Note  Patient: Julie Patterson  Procedure(s) Performed: Procedure(s) (LRB): CESAREAN SECTION (N/A)  Patient location during evaluation: Mother Baby Anesthesia Type: Spinal Level of consciousness: oriented and awake and alert Pain management: pain level controlled Vital Signs Assessment: post-procedure vital signs reviewed and stable Respiratory status: spontaneous breathing, respiratory function stable and patient connected to nasal cannula oxygen Cardiovascular status: blood pressure returned to baseline and stable Postop Assessment: no headache and no backache Anesthetic complications: no    Last Vitals:  Filed Vitals:   01/03/16 0001 01/03/16 0434  BP: 109/74 92/52  Pulse: 80 59  Temp: 36.9 C 36.9 C  Resp: 18 18    Last Pain:  Filed Vitals:   01/03/16 0435  PainSc: 0-No pain                 Alison Stalling

## 2016-01-03 NOTE — Anesthesia Post-op Follow-up Note (Signed)
  Anesthesia Pain Follow-up Note  Patient: Julie Patterson  Day #: 1  Date of Follow-up: 01/03/2016 Time: 7:47 AM  Last Vitals:  Filed Vitals:   01/03/16 0001 01/03/16 0434  BP: 109/74 92/52  Pulse: 80 59  Temp: 36.9 C 36.9 C  Resp: 18 18    Level of Consciousness: alert  Pain: 5 /10   Side Effects:None  Catheter Site Exam:clean, dry, no drainage  Plan: D/C from anesthesia care  Alison Stalling

## 2016-01-03 NOTE — Progress Notes (Signed)
  Post-operative Day 1  Subjective: no complaints, up ad lib, voiding, tolerating PO and + flatus  Objective: Blood pressure 109/53, pulse 63, temperature 98.5 F (36.9 C), temperature source Oral, resp. rate 12, height 4\' 11"  (1.499 m), weight 207 lb (93.895 kg), SpO2 99 %  Physical Exam:  General: alert and cooperative Lochia: appropriate Uterine Fundus: firm Incision: no significant drainage, dressing dry & intact DVT Evaluation: No evidence of DVT seen on physical exam. Abdomen: soft, NT   Recent Labs  01/01/16 1238 01/03/16 0657  HGB 12.4 10.9*  HCT 37.1 32.6*    Assessment POD #1, repeat LTCS  Plan: Continue PO care, Advance activity as tolerated and Discharge POD 2 or 3  Feeding: bottle Contraception: OCP Blood Type: O+ RI/VI TDAP UTD Flu UTD    Burlene Arnt, [place] 01/03/2016, 11:32 AM

## 2016-01-04 MED ORDER — OXYCODONE-ACETAMINOPHEN 5-325 MG PO TABS: 1.0000 | ORAL_TABLET | ORAL | Status: DC | PRN

## 2016-01-04 NOTE — Discharge Instructions (Signed)
Cesarean Delivery, Care After °Refer to this sheet in the next few weeks. These instructions provide you with information on caring for yourself after your procedure. Your health care provider may also give you specific instructions. Your treatment has been planned according to current medical practices, but problems sometimes occur. Call your health care provider if you have any problems or questions after you go home. °HOME CARE INSTRUCTIONS  °· Only take over-the-counter or prescription medications as directed by your health care provider. °· Do not drink alcohol, especially if you are breastfeeding or taking medication to relieve pain. °· Do not chew or smoke tobacco. °· Continue to use good perineal care. Good perineal care includes: °¨ Wiping your perineum from front to back. °¨ Keeping your perineum clean. °· Check your surgical cut (incision) daily for increased redness, drainage, swelling, or separation of skin. °· Clean your incision gently with soap and water every day, and then pat it dry. If your health care provider says it is okay, leave the incision uncovered. Use a bandage (dressing) if the incision is draining fluid or appears irritated. If the adhesive strips across the incision do not fall off within 7 days, carefully peel them off. °· Hug a pillow when coughing or sneezing until your incision is healed. This helps to relieve pain. °· Do not use tampons or douche until your health care provider says it is okay. °· Shower, wash your hair, and take tub baths as directed by your health care provider. °· Wear a well-fitting bra that provides breast support. °· Limit wearing support panties or control-top hose. °· Drink enough fluids to keep your urine clear or pale yellow. °· Eat high-fiber foods such as whole grain cereals and breads, brown rice, beans, and fresh fruits and vegetables every day. These foods may help prevent or relieve constipation. °· Resume activities such as climbing stairs,  driving, lifting, exercising, or traveling as directed by your health care provider. °· Talk to your health care provider about resuming sexual activities. This is dependent upon your risk of infection, your rate of healing, and your comfort and desire to resume sexual activity. °· Try to have someone help you with your household activities and your newborn for at least a few days after you leave the hospital. °· Rest as much as possible. Try to rest or take a nap when your newborn is sleeping. °· Increase your activities gradually. °· Keep all of your scheduled postpartum appointments. It is very important to keep your scheduled follow-up appointments. At these appointments, your health care provider will be checking to make sure that you are healing physically and emotionally. °SEEK MEDICAL CARE IF:  °· You are passing large clots from your vagina. Save any clots to show your health care provider. °· You have a foul smelling discharge from your vagina. °· You have trouble urinating. °· You are urinating frequently. °· You have pain when you urinate. °· You have a change in your bowel movements. °· You have increasing redness, pain, or swelling near your incision. °· You have pus draining from your incision. °· Your incision is separating. °· You have painful, hard, or reddened breasts. °· You have a severe headache. °· You have blurred vision or see spots. °· You feel sad or depressed. °· You have thoughts of hurting yourself or your newborn. °· You have questions about your care, the care of your newborn, or medications. °· You are dizzy or light-headed. °· You have a rash. °· You   have pain, redness, or swelling at the site of the removed intravenous access (IV) tube.  You have nausea or vomiting.  You stopped breastfeeding and have not had a menstrual period within 12 weeks of stopping.  You are not breastfeeding and have not had a menstrual period within 12 weeks of delivery.  You have a fever. SEEK  IMMEDIATE MEDICAL CARE IF:  You have persistent pain.  You have chest pain.  You have shortness of breath.  You faint.  You have leg pain.  You have stomach pain.  Your vaginal bleeding saturates 2 or more sanitary pads in 1 hour. MAKE SURE YOU:   Understand these instructions.  Will watch your condition.  Will get help right away if you are not doing well or get worse.   This information is not intended to replace advice given to you by your health care provider. Make sure you discuss any questions you have with your health care provider.   Please call your doctor or return to the ER if you experience any chest pains, shortness of breath, fever greater than 101, any heavy bleeding or large clots, and foul smelling vaginal discharge, any worsening abdominal pain & cramping that is not controlled by pain medication, or any signs of post partum depression.  No tampons, enemas, douches, or sexual intercourse for 6 weeks.  Also avoid tub baths, hot tubs, or swimming for 6 weeks.

## 2016-01-04 NOTE — Progress Notes (Signed)
Post-operative Day 2  Subjective: no complaints, up ad lib, voiding, tolerating PO and + flatus  Objective: Blood pressure 109/53, pulse 63, temperature 98.5 F (36.9 C), temperature source Oral, resp. rate 12, height 4\' 11"  (1.499 m), weight 207 lb (93.895 kg), SpO2 99 %  Physical Exam:  General: alert and cooperative Lochia: appropriate Uterine Fundus: firm Incision: no significant drainage, dressing dry & intact DVT Evaluation: No evidence of DVT seen on physical exam. Abdomen: soft, NT   Recent Labs  01/01/16 1238 01/03/16 0657  HGB 12.4 10.9*  HCT 37.1 32.6*    Assessment POD #2, repeat LTCS  Plan: Discharge Home and Continue PO care  Feeding: bottle Contraception: OCP Blood Type: O+ RI/VI TDAP UTD Flu UTD Continue Labetalol for now, plan transition to HCTZ later F/u Wednesday  Hoyt Koch, MD 01/04/2016, 8:54 AM

## 2016-01-04 NOTE — Progress Notes (Signed)
D/C order from MD.  Reviewed d/c instructions and prescriptions with patient and answered any questions.  Patient d/c home with infant via wheelchair by nursing/auxillary. 

## 2016-02-09 LAB — HM PAP SMEAR: HM Pap smear: NEGATIVE

## 2017-08-19 ENCOUNTER — Emergency Department
Admission: EM | Admit: 2017-08-19 | Discharge: 2017-08-20 | Disposition: A | Discharge status: Home / Self Care | Payer: BC Managed Care – PPO | Attending: Emergency Medicine | Admitting: Emergency Medicine

## 2017-08-19 ENCOUNTER — Emergency Department: Payer: BC Managed Care – PPO

## 2017-08-19 DIAGNOSIS — E119 Type 2 diabetes mellitus without complications: Secondary | ICD-10-CM | POA: Diagnosis not present

## 2017-08-19 DIAGNOSIS — R102 Pelvic and perineal pain: Secondary | ICD-10-CM | POA: Diagnosis present

## 2017-08-19 DIAGNOSIS — Z7982 Long term (current) use of aspirin: Secondary | ICD-10-CM | POA: Insufficient documentation

## 2017-08-19 DIAGNOSIS — Z79899 Other long term (current) drug therapy: Secondary | ICD-10-CM | POA: Insufficient documentation

## 2017-08-19 DIAGNOSIS — J45909 Unspecified asthma, uncomplicated: Secondary | ICD-10-CM | POA: Diagnosis not present

## 2017-08-19 DIAGNOSIS — I10 Essential (primary) hypertension: Secondary | ICD-10-CM | POA: Insufficient documentation

## 2017-08-19 LAB — URINALYSIS, COMPLETE (UACMP) WITH MICROSCOPIC
BILIRUBIN URINE: NEGATIVE
Glucose, UA: NEGATIVE mg/dL
Hgb urine dipstick: NEGATIVE
Ketones, ur: NEGATIVE mg/dL
LEUKOCYTES UA: NEGATIVE
NITRITE: NEGATIVE
PH: 6 (ref 5.0–8.0)
Protein, ur: NEGATIVE mg/dL
RBC / HPF: NONE SEEN RBC/hpf (ref 0–5)
SPECIFIC GRAVITY, URINE: 1.023 (ref 1.005–1.030)

## 2017-08-19 LAB — CBC
HEMATOCRIT: 38.4 % (ref 35.0–47.0)
Hemoglobin: 13.3 g/dL (ref 12.0–16.0)
MCH: 27.1 pg (ref 26.0–34.0)
MCHC: 34.6 g/dL (ref 32.0–36.0)
MCV: 78.6 fL — ABNORMAL LOW (ref 80.0–100.0)
PLATELETS: 287 10*3/uL (ref 150–440)
RBC: 4.88 MIL/uL (ref 3.80–5.20)
RDW: 12.8 % (ref 11.5–14.5)
WBC: 6.3 10*3/uL (ref 3.6–11.0)

## 2017-08-19 LAB — COMPREHENSIVE METABOLIC PANEL
ALT: 16 U/L (ref 14–54)
AST: 22 U/L (ref 15–41)
Albumin: 3.8 g/dL (ref 3.5–5.0)
Alkaline Phosphatase: 61 U/L (ref 38–126)
Anion gap: 6 (ref 5–15)
BILIRUBIN TOTAL: 0.3 mg/dL (ref 0.3–1.2)
BUN: 14 mg/dL (ref 6–20)
CO2: 27 mmol/L (ref 22–32)
CREATININE: 0.85 mg/dL (ref 0.44–1.00)
Calcium: 9 mg/dL (ref 8.9–10.3)
Chloride: 105 mmol/L (ref 101–111)
GFR calc Af Amer: >60 mL/min (ref >60)
Glucose, Bld: 121 mg/dL — ABNORMAL HIGH (ref 65–99)
POTASSIUM: 3.5 mmol/L (ref 3.5–5.1)
Sodium: 138 mmol/L (ref 135–145)
TOTAL PROTEIN: 7.1 g/dL (ref 6.5–8.1)

## 2017-08-19 LAB — WET PREP, GENITAL
CLUE CELLS WET PREP: NONE SEEN
Sperm: NONE SEEN
Trich, Wet Prep: NONE SEEN
WBC, Wet Prep HPF POC: NONE SEEN
Yeast Wet Prep HPF POC: NONE SEEN

## 2017-08-19 MED ORDER — KETOROLAC TROMETHAMINE 30 MG/ML IJ SOLN
30.0000 mg | Freq: Once | INTRAMUSCULAR | Status: AC
  Administered 2017-08-19: 30 mg via INTRAVENOUS
  Filled 2017-08-19: qty 1

## 2017-08-19 NOTE — ED Provider Notes (Signed)
[facility] Emergency Department Provider Note ____________________________________________   First MD Initiated Contact with Patient 08/19/17 2120     (approximate)  I have reviewed the triage vital signs and the nursing notes.   HISTORY  Chief Complaint Abdominal Pain    HPI Julie Patterson is a 31 y.o. female with PMH as noted below presents with right suprapubic pain for the last 2 weeks intermittently, and now constant and worsening over the last 2 days. Patient denies associated nausea or vomiting, fever or chills, vaginal bleeding or discharge, or urinary symptoms. No diarrhea. No prior history of this pain. She states she is sexually active with one partner.  Past Medical History:  Diagnosis Date  . Asthma    childhood asthma  . Diabetes mellitus without complication (HCC)    borderline  . GERD (gastroesophageal reflux disease)   . History of cesarean delivery 12/12/2015  . Hypertension     Patient Active Problem List   Diagnosis Date Noted  . Chronic hypertension in obstetric context in third trimester 01/02/2016  . Obesity affecting pregnancy in third trimester, antepartum 01/02/2016  . BMI 40.0-44.9, adult ([place]) 01/02/2016  . [redacted] weeks gestation of pregnancy 01/02/2016  . Status post cesarean section 01/02/2016  . History of cesarean delivery 12/12/2015    Past Surgical History:  Procedure Laterality Date  . APPENDECTOMY    . CESAREAN SECTION    . CESAREAN SECTION N/A 01/02/2016   Procedure: CESAREAN SECTION;  Surgeon: Will Bonnet, MD;  Location: [facility] ORS;  Service: Obstetrics;  Laterality: N/A;    Prior to Admission medications   Medication Sig Start Date End Date Taking? Authorizing Provider  aspirin 81 MG tablet Take 81 mg by mouth daily.    [provider]  labetalol (NORMODYNE) 100 MG tablet Take 100 mg by mouth 2 (two) times daily.    [provider]  oxyCODONE-acetaminophen (PERCOCET/ROXICET) 5-325 MG  tablet Take 1-2 tablets by mouth every 4 (four) hours as needed for severe pain. 01/04/16   Gae Dry, MD  prenatal vitamin w/FE, FA (PRENATAL 1 + 1) 27-1 MG TABS tablet Take 1 tablet by mouth daily at 12 noon.    [provider]    Allergies Penicillins  Family History  Problem Relation Age of Onset  . Diabetes Mother   . Diabetes Father     Social History Social History  Substance Use Topics  . Smoking status: Never Smoker  . Smokeless tobacco: Never Used  . Alcohol use No    Review of Systems  Constitutional: No fever/chills Eyes: No visual changes. ENT: No sore throat. Cardiovascular: Denies chest pain. Respiratory: Denies shortness of breath. Gastrointestinal: No nausea, no vomiting.  No diarrhea.  Genitourinary: Negative for dysuria.  Musculoskeletal: Negative for back pain. Skin: Negative for rash. Neurological: Negative for headache.   ____________________________________________   PHYSICAL EXAM:  VITAL SIGNS: ED Triage Vitals  Enc Vitals Group     BP 08/19/17 2118 (!) 148/99     Pulse Rate 08/19/17 2101 (!) 58     Resp 08/19/17 2101 18     Temp 08/19/17 2101 98.2 F (36.8 C)     Temp Source 08/19/17 2101 Oral     SpO2 08/19/17 2101 99 %     Weight 08/19/17 2101 225 lb (102.1 kg)     Height 08/19/17 2101 4\' 11"  (1.499 m)     Head Circumference --      Peak Flow --  Pain Score 08/19/17 2100 8     Pain Loc --      Pain Edu? --      Excl. in [place]? --     Constitutional: Alert and oriented. Well appearing and in no acute distress. Eyes: Conjunctivae are normal.  Head: Atraumatic. Nose: No congestion/rhinnorhea. Mouth/Throat: Mucous membranes are moist.   Neck: Normal range of motion.  Cardiovascular: Good peripheral circulation. Respiratory: Normal respiratory effort.   Gastrointestinal: Soft with mild R suprapubic tenderness. No distention.  Genitourinary: No CVA tenderness. Musculoskeletal: Extremities warm and well perfused.   Neurologic:  Normal speech and language. No gross focal neurologic deficits are appreciated.  Skin:  Skin is warm and dry. No rash noted. Psychiatric: Mood and affect are normal. Speech and behavior are normal.  ____________________________________________   LABS (all labs ordered are listed, but only abnormal results are displayed)  Labs Reviewed  CBC - Abnormal; Notable for the following:       Result Value   MCV 78.6 (*)    All other components within normal limits  COMPREHENSIVE METABOLIC PANEL  URINALYSIS, COMPLETE (UACMP) WITH MICROSCOPIC   ____________________________________________  EKG   ____________________________________________  RADIOLOGY  US pelvis with no acute findings.    ____________________________________________   PROCEDURES  Procedure(s) performed: No    Critical Care performed: No ____________________________________________   INITIAL IMPRESSION / ASSESSMENT AND PLAN / ED COURSE  Pertinent labs & imaging results that were available during my care of the patient were reviewed by me and considered in my medical decision making (see chart for details).  31 year old female with past medical history as noted presents with right suprapubic pain for the last 2 weeks, and worsening in the last 2 days. No significant associated symptoms. Patient is very well appearing, vital signs are normal, and exam reveals mild focal tenderness to the R suprapubic area.  Given no vaginal bleeding or discharge, based on discussion with patient, pelvic exam deferred at this time.  Ddx incl ovarian cyst, [place]chmirz, fibroid, endometriosis or other benign gynecologic cause, less likely UTI/cystitis.  Very low susp for torsion given pt's well appearance and time course of the pain. Low susp for colitis or diverticulitis given pt's age, and lack of fever, n/v or diarrhea.  Pt is s/p appendectomy.  Plan for labs, UA, pelvic US and reassess.       ----------------------------------------- 11:42 PM on 08/19/2017 -----------------------------------------  Pelvic ultrasound, labs, and UA are negative. I performed a pelvic exam and there is physiologic discharge and no significant tenderness.  There is still no tenderness in the RLQ.  Will obtain wet prep and GC/CT. If wet prep negative will discharge home with recommendation take NSAIDs and follow up with an OB/GYN within the next 1-2 weeks.  Patient feels well to go home and feels comfortable with this plan. Return precautions given.  ----------------------------------------- 12:05 AM on 08/20/2017 -----------------------------------------  Wet prep is negative. Given the patient has no high-risk sexual history and unremarkable pelvic, there is no reason to keep her in the ED to await the GC/CT swab so I will discharge home and she will get follow-up call if it is positive. ____________________________________________   FINAL CLINICAL IMPRESSION(S) / ED DIAGNOSES  Final diagnoses:  Pelvic pain      NEW MEDICATIONS STARTED DURING THIS VISIT:  New Prescriptions   No medications on file     Note:  This document was prepared using Dragon voice recognition software and may include unintentional dictation errors.  Arta Silence, MD 08/20/17 [phone]

## 2017-08-19 NOTE — ED Triage Notes (Signed)
Onset of right lower quadrant pain about two weeks ago. Today is worse. Patient denies dysuria, urgency or frequency. States the pain is so bad she can't pick up her small daughter. Patient denies N/V/D.

## 2017-08-19 NOTE — ED Notes (Signed)
Pt states she is having "sharp, constant pain" on right lower abdominal area. Started a couple of weeks ago but got worse today. Family at bedside.

## 2017-08-20 LAB — CHLAMYDIA/NGC RT PCR (ARMC ONLY)
Chlamydia Tr: NOT DETECTED
N gonorrhoeae: NOT DETECTED

## 2017-08-20 MED ORDER — IBUPROFEN 600 MG PO TABS: 600.0000 mg | ORAL_TABLET | Freq: Four times a day (QID) | ORAL | 0 refills | Status: DC | PRN

## 2017-08-20 NOTE — Discharge Instructions (Signed)
Return to the ER for new or worsening pain, constant pain, vaginal bleeding or discharge, fever or chills, vomiting, or any other new or worsening symptoms that concern you. Follow-up with the gynecologist within the next 1-2 weeks.

## 2018-01-13 IMAGING — US US PELVIS COMPLETE
1 series · 14 of 25 positions shown · non-contrast
Comparison: None

CLINICAL DATA: Pelvic pain for 2 weeks. Last menstrual period
months prior 03/29/2017, negative urine pregnancy test today.

EXAM:
TRANSABDOMINAL AND TRANSVAGINAL ULTRASOUND OF PELVIS
TECHNIQUE: Both transabdominal and transvaginal ultrasound examinations of the
pelvis were performed. Transabdominal technique was performed for
global imaging of the pelvis including uterus, ovaries, adnexal
regions, and pelvic cul-de-sac. It was necessary to proceed with
endovaginal exam following the transabdominal exam to visualize the
uterus, endometrium and adnexa.

[Series 1: us pelvis complete · 0.21mm/px · 14 of 62 slices shown]
[im 1/62]
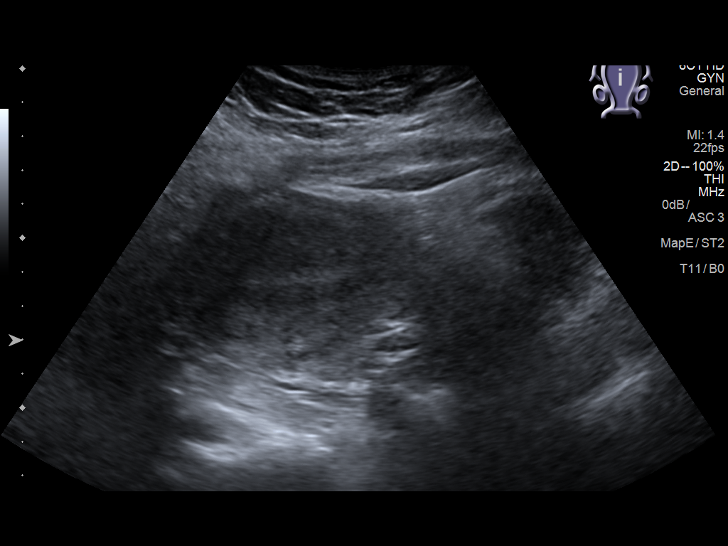
[im 6/62]
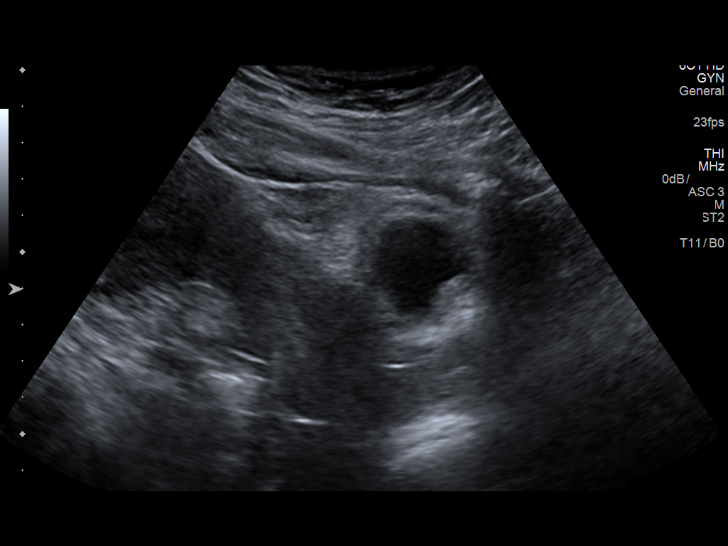
[im 11/62]
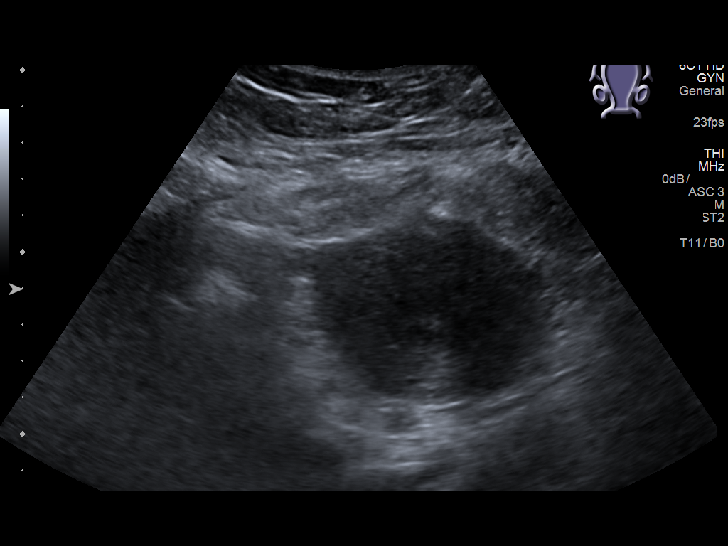
[im 16/62]
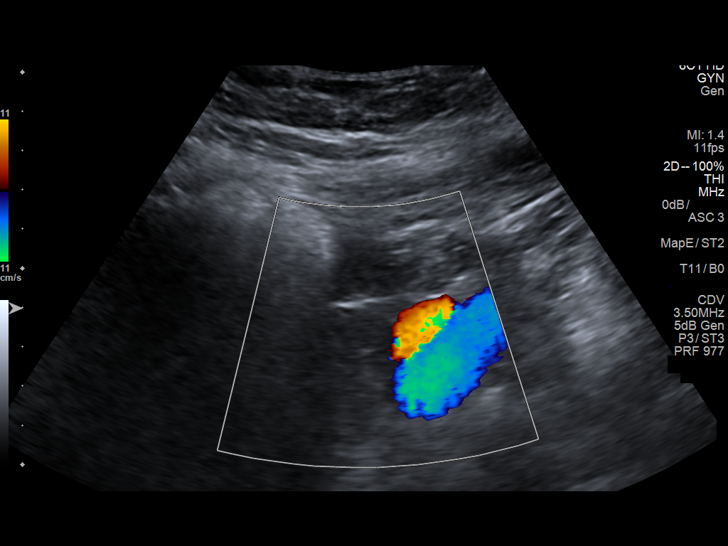
[im 21/62]
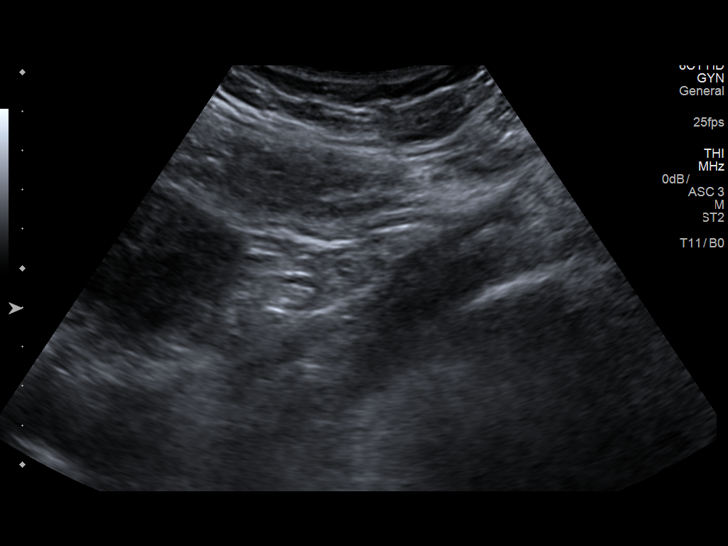
[im 23/62]
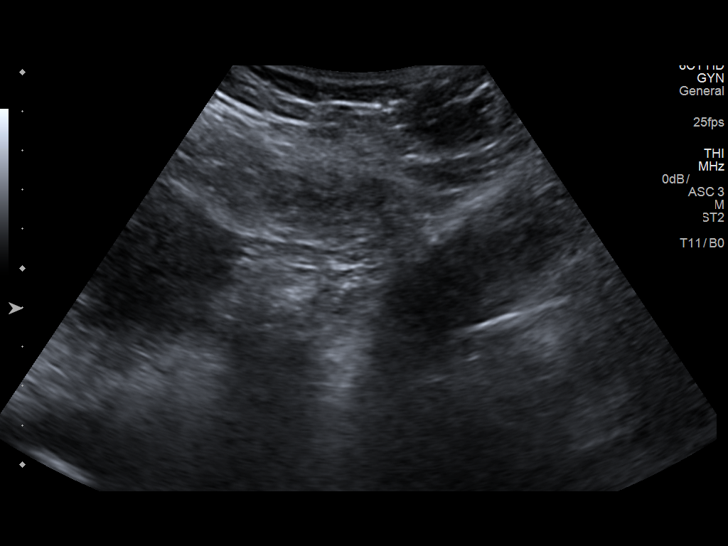
[im 28/62]
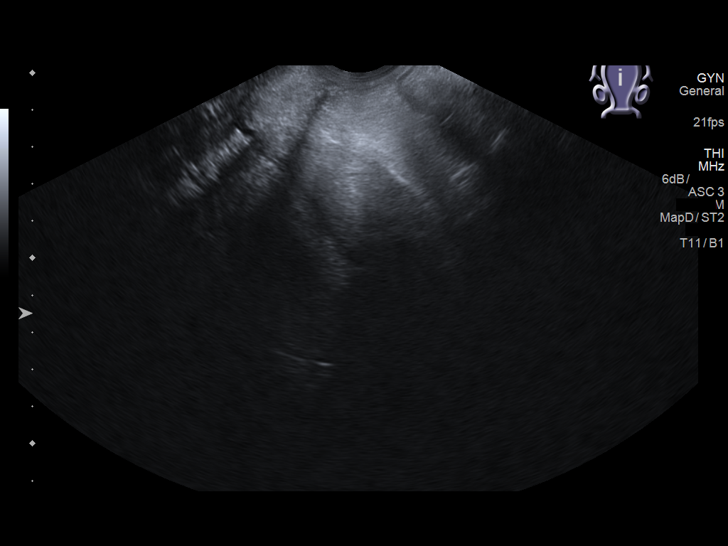
[im 34/62]
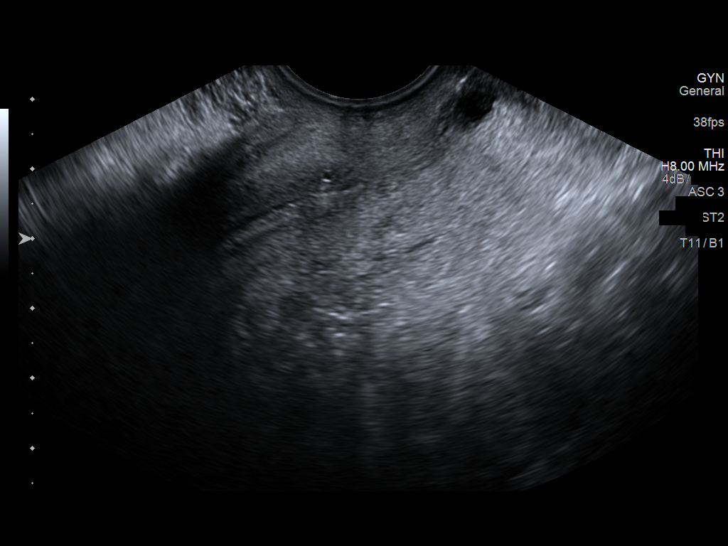
[im 39/62]
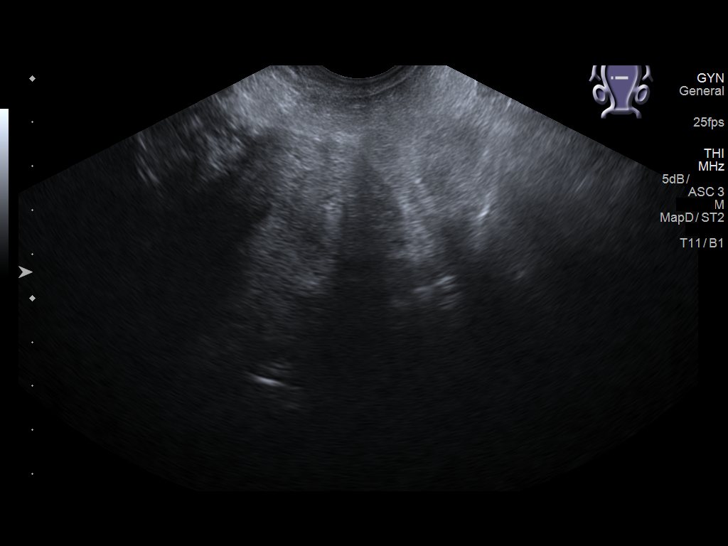
[im 41/62]
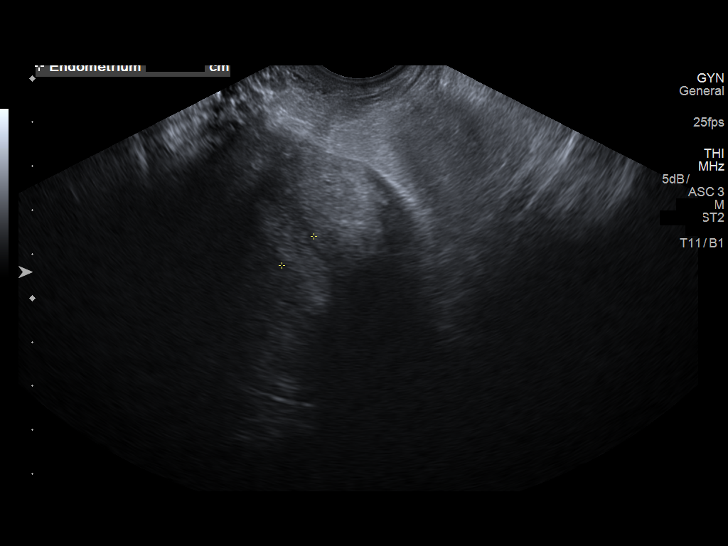
[im 46/62]
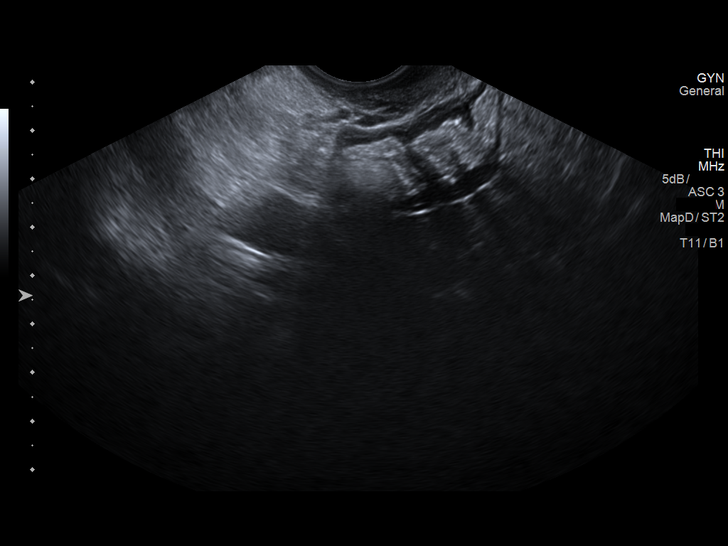
[im 51/62]
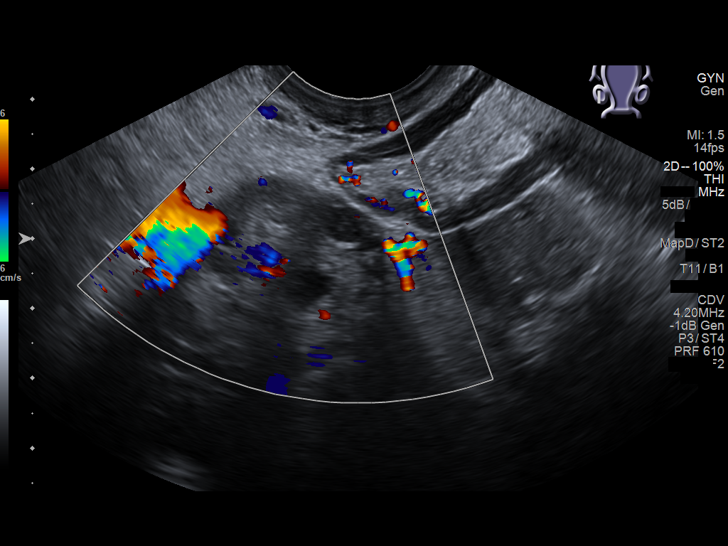
[im 56/62]
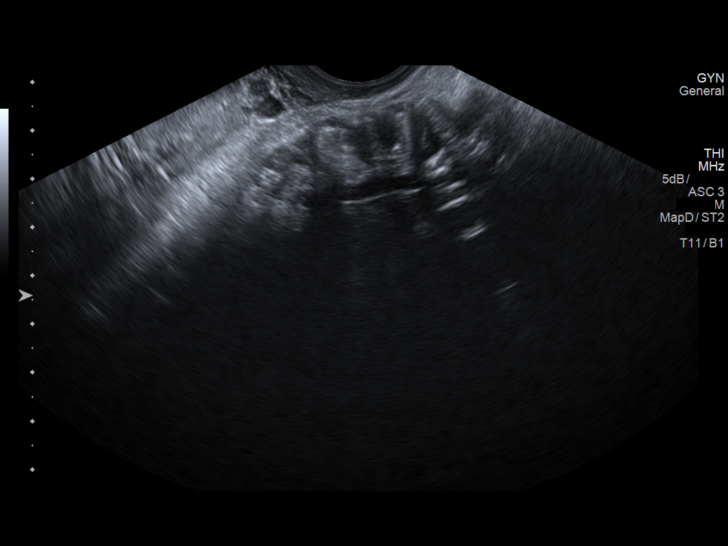
[im 62/62]
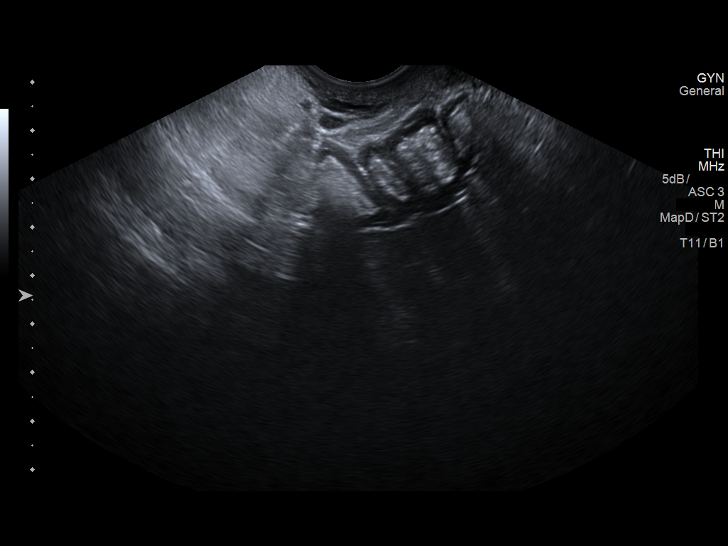

[14 of 25 positions shown; findings below may reference images not displayed]

FINDINGS: Uterus

Measurements: 9.2 x 4.5 x 6.0 cm. No fibroids or other mass
visualized. The uterus is retroverted, limiting transvaginal
assessment.

Endometrium

Thickness: 10 mm, normal.  No focal abnormality visualized.

Right ovary

Measurements: 2.6 x 1.8 x 2.2 cm with normal blood flow. Normal
appearance with physiologic follicles. No adnexal mass.

Left ovary

Measurements: 3.5 x 0.7 x 2.6 cm with normal blood flow. Normal
appearance/no adnexal mass. Left ovary only seen transabdominally.

Other findings

No abnormal free fluid.
IMPRESSION: Normal pelvic ultrasound.

## 2018-01-21 ENCOUNTER — Ambulatory Visit: Payer: Self-pay | Admitting: Obstetrics and Gynecology

## 2018-01-23 ENCOUNTER — Ambulatory Visit: Payer: BC Managed Care – PPO | Admitting: Obstetrics and Gynecology

## 2018-01-23 ENCOUNTER — Encounter: Payer: Self-pay | Admitting: Obstetrics and Gynecology

## 2018-01-23 VITALS — BP 124/74 | Ht 59.0 in | Wt 240.0 lb

## 2018-01-23 DIAGNOSIS — N914 Secondary oligomenorrhea: Secondary | ICD-10-CM

## 2018-01-23 LAB — POCT URINE PREGNANCY: PREG TEST UR: NEGATIVE

## 2018-01-23 MED ORDER — MEDROXYPROGESTERONE ACETATE 10 MG PO TABS: 10.0000 mg | ORAL_TABLET | Freq: Every day | ORAL | 0 refills | Status: DC

## 2018-01-23 NOTE — Progress Notes (Signed)
Obstetrics & Gynecology Office Visit   Chief Complaint  Patient presents with  . Menstrual Problem    infrequent menses with current spotting   History of Present Illness: 32 y.o. G47P2002 female who presents with a recent history of no menses for the past 8 months.  However, she started spotting about a month ago and has spotting every other day. On days she has spotting she also has right lower quadrant abdominal pain.  She is not using a contraceptive.  She is sexually active.  She had a negative urine pregnancy test 2 weeks ago.  She has a two year-old daughter that was delivered by c-section. She is not breastfeeding.  Prior to her current oligomenorrhea she had regular, monthly menses.  However, her menses just stopped.  Sh denies other symptoms. Denies headaches, vision changes, galactorrhea, skin and hair changes, constipation, diarrhea, hair growth, acne.    Past Medical History:  Diagnosis Date  . Asthma    childhood asthma  . Diabetes mellitus without complication (HCC)    borderline  . GERD (gastroesophageal reflux disease)   . History of cesarean delivery 12/12/2015  . Hypertension     Past Surgical History:  Procedure Laterality Date  . APPENDECTOMY    . CESAREAN SECTION    . CESAREAN SECTION N/A 01/02/2016   Procedure: CESAREAN SECTION;  Surgeon: Will Bonnet, MD;  Location: [facility] ORS;  Service: Obstetrics;  Laterality: N/A;    Gynecologic History: No LMP recorded. Patient is not currently having periods (Reason: Irregular Periods).  Obstetric History: [id number]  Family History  Problem Relation Age of Onset  . Diabetes Mother   . Diabetes Father     Social History   Socioeconomic History  . Marital status: Single    Spouse name: Not on file  . Number of children: Not on file  . Years of education: Not on file  . Highest education level: Not on file  Social Needs  . Financial resource strain: Not on file  . Food insecurity - worry: Not on file  . Food  insecurity - inability: Not on file  . Transportation needs - medical: Not on file  . Transportation needs - non-medical: Not on file  Occupational History  . Not on file  Tobacco Use  . Smoking status: Never Smoker  . Smokeless tobacco: Never Used  Substance and Sexual Activity  . Alcohol use: No  . Drug use: No  . Sexual activity: Yes    Birth control/protection: None  Other Topics Concern  . Not on file  Social History Narrative  . Not on file    Allergies  Allergen Reactions  . Penicillins Hives    Prior to Admission medications   Medication Sig Start Date End Date Taking? Authorizing Provider  aspirin 81 MG tablet Take 81 mg by mouth daily.    [provider]  labetalol (NORMODYNE) 100 MG tablet Take 100 mg by mouth 2 (two) times daily.    [provider]  prenatal vitamin w/FE, FA (PRENATAL 1 + 1) 27-1 MG TABS tablet Take 1 tablet by mouth daily at 12 noon.    [provider]    Review of Systems  Constitutional: Negative.   HENT: Negative.   Eyes: Negative.   Respiratory: Negative.   Cardiovascular: Negative.   Gastrointestinal: Negative.   Genitourinary: Negative.        See HPI for details regarding bleeding  Musculoskeletal: Negative.   Skin: Negative.   Neurological: Negative.  Psychiatric/Behavioral: Negative.      Physical Exam BP 124/74   Ht 4\' 11"  (1.499 m)   Wt 240 lb (108.9 kg)   BMI 48.47 kg/m  No LMP recorded. Patient is not currently having periods (Reason: Irregular Periods). Physical Exam  Constitutional: She is oriented to person, place, and time. She appears well-developed and well-nourished. No distress.  obese  HENT:  Head: Normocephalic and atraumatic.  +acanthosis nigricans on posterior neck  Eyes: EOM are normal. No scleral icterus.  Neck: Normal range of motion. Neck supple.  Cardiovascular: Normal rate and regular rhythm.  Pulmonary/Chest: Effort normal and breath sounds normal. No respiratory  distress. She has no wheezes. She has no rales.  Abdominal: Soft. Bowel sounds are normal. She exhibits no distension and no mass. There is no tenderness. There is no rebound and no guarding.  Musculoskeletal: Normal range of motion. She exhibits no edema.  Neurological: She is alert and oriented to person, place, and time. No cranial nerve deficit.  Skin: Skin is warm and dry. No erythema.  Psychiatric: She has a normal mood and affect. Her behavior is normal. Judgment normal.   Female chaperone present for pelvic and breast  portions of the physical exam  Urine pregnancy test: negative  Assessment: 32 y.o. G64P2002 female here for  1. Secondary oligomenorrhea      Plan: Problem List Items Addressed This Visit    None    Visit Diagnoses    Secondary oligomenorrhea    -  Primary   Relevant Medications   medroxyPROGESTERone (PROVERA) 10 MG tablet   Other Relevant Orders   US PELVIS TRANSVANGINAL NON-OB (TV ONLY)   POCT urine pregnancy (Completed)     Will affect withdrawal bleeding with Provera.  Pelvic ultrasound ordered next.  Will consider workup for PCOS, as she does appear to have oligomenorrhea of an unknown cause.   20 minutes spent in face to face discussion with > 50% spent in counseling,management, and coordination of care of her secondary oligomenorrhea.   Prentice Docker, MD 01/28/2018 1:33 PM

## 2018-01-28 ENCOUNTER — Encounter: Payer: Self-pay | Admitting: Obstetrics and Gynecology

## 2018-02-02 ENCOUNTER — Ambulatory Visit: Payer: BC Managed Care – PPO | Admitting: Obstetrics and Gynecology

## 2018-02-02 ENCOUNTER — Other Ambulatory Visit: Payer: BC Managed Care – PPO

## 2018-02-09 ENCOUNTER — Ambulatory Visit (INDEPENDENT_AMBULATORY_CARE_PROVIDER_SITE_OTHER): Payer: BC Managed Care – PPO

## 2018-02-09 ENCOUNTER — Encounter: Payer: Self-pay | Admitting: Obstetrics and Gynecology

## 2018-02-09 ENCOUNTER — Ambulatory Visit: Payer: BC Managed Care – PPO | Admitting: Obstetrics and Gynecology

## 2018-02-09 VITALS — BP 122/78 | Wt 240.0 lb

## 2018-02-09 DIAGNOSIS — N914 Secondary oligomenorrhea: Secondary | ICD-10-CM | POA: Insufficient documentation

## 2018-02-09 NOTE — Progress Notes (Signed)
Gynecology Ultrasound Follow Up   Chief Complaint  Patient presents with  . Follow-up  Oligomenorrea  History of Present Illness: Patient is a 32 y.o. female who presents today for ultrasound evaluation of the above .  Ultrasound demonstrates the following findings Adnexa: no masses seen  Uterus: retroverted with endometrial stripe  10.4 mm Additional: anterior fundal fibroid 2.2 x 1.6 cm  She had a withdrawal bleed from her Provera.   Past Medical History:  Diagnosis Date  . Asthma    childhood asthma  . Diabetes mellitus without complication (HCC)    borderline  . GERD (gastroesophageal reflux disease)   . History of cesarean delivery 12/12/2015  . Hypertension     Past Surgical History:  Procedure Laterality Date  . APPENDECTOMY    . CESAREAN SECTION    . CESAREAN SECTION N/A 01/02/2016   Procedure: CESAREAN SECTION;  Surgeon: Will Bonnet, MD;  Location: [facility] ORS;  Service: Obstetrics;  Laterality: N/A;   Family History  Problem Relation Age of Onset  . Diabetes Mother   . Diabetes Father     Social History   Socioeconomic History  . Marital status: Single    Spouse name: Not on file  . Number of children: Not on file  . Years of education: Not on file  . Highest education level: Not on file  Social Needs  . Financial resource strain: Not on file  . Food insecurity - worry: Not on file  . Food insecurity - inability: Not on file  . Transportation needs - medical: Not on file  . Transportation needs - non-medical: Not on file  Occupational History  . Not on file  Tobacco Use  . Smoking status: Never Smoker  . Smokeless tobacco: Never Used  Substance and Sexual Activity  . Alcohol use: No  . Drug use: No  . Sexual activity: Yes    Birth control/protection: None  Other Topics Concern  . Not on file  Social History Narrative  . Not on file    Allergies  Allergen Reactions  . Penicillins Hives    Prior to Admission medications     Medication Sig Start Date End Date Taking? Authorizing Provider  aspirin 81 MG tablet Take 81 mg by mouth daily.    [provider]  labetalol (NORMODYNE) 100 MG tablet Take 100 mg by mouth 2 (two) times daily.    [provider]  medroxyPROGESTERone (PROVERA) 10 MG tablet Take 1 tablet (10 mg total) by mouth daily for 10 days. 01/23/18 02/02/18  Will Bonnet, MD  prenatal vitamin w/FE, FA (PRENATAL 1 + 1) 27-1 MG TABS tablet Take 1 tablet by mouth daily at 12 noon.    [provider]    Physical Exam BP 122/78   Wt 240 lb (108.9 kg)   BMI 48.47 kg/m    General: NAD HEENT: normocephalic, anicteric Pulmonary: No increased work of breathing Extremities: no edema, erythema, or tenderness Neurologic: Grossly intact, normal gait Psychiatric: mood appropriate, affect full   Assessment: 32 y.o. G2P2002  1. Secondary oligomenorrhea      Plan: Problem List Items Addressed This Visit      Other   Secondary oligomenorrhea - Primary   Relevant Orders   17-Hydroxyprogesterone   DHEA-sulfate   Hgb A1c w/o eAG   Prolactin   Testosterone,Free and Total   TSH + free T4     PCOS workup.  Images reviewed. Not an overall classic PCOS picture apart from  weight.   Will follow up based on lab results.  At a minimum, will need a withdrawal bleed with regularity.   15 minutes spent in face to face discussion with > 50% spent in counseling,management, and coordination of care of her secondary oligomenorrhea.   Prentice Docker, MD, [facility], [facility] 02/09/2018 1:52 PM

## 2018-02-10 ENCOUNTER — Encounter: Payer: Self-pay | Admitting: Obstetrics and Gynecology

## 2018-02-15 LAB — HGB A1C W/O EAG: Hgb A1c MFr Bld: 5.8 % — ABNORMAL HIGH (ref 4.8–5.6)

## 2018-02-15 LAB — TESTOSTERONE,FREE AND TOTAL
TESTOSTERONE FREE: 0.7 pg/mL (ref 0.0–4.2)
Testosterone: 8 ng/dL (ref 8–48)

## 2018-02-15 LAB — TSH+FREE T4
FREE T4: 0.93 ng/dL (ref 0.82–1.77)
TSH: 1.85 u[IU]/mL (ref 0.450–4.500)

## 2018-02-15 LAB — DHEA-SULFATE: DHEA SO4: 60.2 ug/dL — AB (ref 84.8–378.0)

## 2018-02-15 LAB — PROLACTIN: Prolactin: 9.7 ng/mL (ref 4.8–23.3)

## 2018-02-15 LAB — 17-HYDROXYPROGESTERONE: 17-Hydroxyprogesterone: 11 ng/dL

## 2018-02-20 ENCOUNTER — Encounter: Payer: Self-pay | Admitting: Obstetrics and Gynecology

## 2018-03-02 ENCOUNTER — Telehealth: Payer: Self-pay | Admitting: Obstetrics and Gynecology

## 2018-03-02 ENCOUNTER — Other Ambulatory Visit: Payer: Self-pay | Admitting: Obstetrics and Gynecology

## 2018-03-02 DIAGNOSIS — E282 Polycystic ovarian syndrome: Secondary | ICD-10-CM

## 2018-03-02 MED ORDER — NORETHINDRONE 0.35 MG PO TABS: 1.0000 | ORAL_TABLET | Freq: Every day | ORAL | 11 refills | Status: DC

## 2018-03-02 MED ORDER — METFORMIN HCL 500 MG PO TABS: 1000.0000 mg | ORAL_TABLET | Freq: Two times a day (BID) | ORAL | 11 refills | Status: DC

## 2018-03-02 NOTE — Telephone Encounter (Signed)
Discussed via phone all options. Discussed risks of PCOS, including high cholesterol, diabetes (she has a hgb a1c of 5.8%, endometrial hyperplasia/cancer, and hyperandrogenism).  She has no current signs of hyperandrogenism. She does want contraception. I recommend weight loss of 10% with regular exercise Metformin 1,000 mg po bid Norethindrone 0.35mg  daily for contraception. Discussed unusual bleeding pattern for this medication that many will experience at first. Follow up in one year.  Prentice Docker, MD, [facility], [facility] 03/02/2018 7:28 PM

## 2018-03-23 ENCOUNTER — Encounter: Payer: Self-pay | Admitting: Obstetrics and Gynecology

## 2019-04-27 ENCOUNTER — Ambulatory Visit: Payer: BC Managed Care – PPO | Admitting: Obstetrics and Gynecology

## 2019-05-03 ENCOUNTER — Ambulatory Visit (INDEPENDENT_AMBULATORY_CARE_PROVIDER_SITE_OTHER): Payer: BC Managed Care – PPO | Admitting: Obstetrics and Gynecology

## 2019-05-03 ENCOUNTER — Other Ambulatory Visit (HOSPITAL_COMMUNITY)
Admission: RE | Admit: 2019-05-03 | Discharge: 2019-05-03 | Disposition: A | Discharge status: Home / Self Care | Payer: BC Managed Care – PPO | Source: Ambulatory Visit | Attending: Obstetrics and Gynecology | Admitting: Obstetrics and Gynecology

## 2019-05-03 ENCOUNTER — Other Ambulatory Visit: Payer: Self-pay

## 2019-05-03 ENCOUNTER — Encounter: Payer: Self-pay | Admitting: Obstetrics and Gynecology

## 2019-05-03 VITALS — BP 124/78 | Ht 59.0 in | Wt 235.0 lb

## 2019-05-03 DIAGNOSIS — Z124 Encounter for screening for malignant neoplasm of cervix: Secondary | ICD-10-CM | POA: Insufficient documentation

## 2019-05-03 DIAGNOSIS — N914 Secondary oligomenorrhea: Secondary | ICD-10-CM | POA: Insufficient documentation

## 2019-05-03 DIAGNOSIS — E282 Polycystic ovarian syndrome: Secondary | ICD-10-CM

## 2019-05-03 DIAGNOSIS — Z113 Encounter for screening for infections with a predominantly sexual mode of transmission: Secondary | ICD-10-CM

## 2019-05-03 NOTE — Progress Notes (Signed)
Obstetrics & Gynecology Office Visit   Chief Complaint  Patient presents with  . Menstrual Problem   History of Present Illness: 33 y.o. G42P2002 female who presents for very heavy menstrual bleeding.  She does have a history of oligomenorrhea (secondary).  Her last period was in May.  She also had one in April.  Prior to that she had no cycles.  She states that she has been taking the norethindrone contraceptive regularly and without withdrawal menses.  During her period in May she had to wear depends and 2 pads.  The menses lasted for about 4 or 5 days.  April was similar.  She denies any changes in medication.  She denies pain with her menses.  She has had no lifestyle changes.  The 2 cycles described above were only 2 weeks apart.  Her last period was on May 27.  She denies pain, feeling lightheaded or dizzy.  She has not taken a urine pregnancy test.  Her prior work-up for secondary oligomenorrhea was significant for PCOS.  She has been taking metformin and asked for refill.  Her last hemoglobin A1c was 5.8.  Past Medical History:  Diagnosis Date  . Asthma    childhood asthma  . Diabetes mellitus without complication (HCC)    borderline  . GERD (gastroesophageal reflux disease)   . History of cesarean delivery 12/12/2015  . Hypertension     Past Surgical History:  Procedure Laterality Date  . APPENDECTOMY    . CESAREAN SECTION    . CESAREAN SECTION N/A 01/02/2016   Procedure: CESAREAN SECTION;  Surgeon: Will Bonnet, MD;  Location: [facility] ORS;  Service: Obstetrics;  Laterality: N/A;    Gynecologic History: Patient's last menstrual period was 04/21/2019.  Obstetric History: [id number]  Family History  Problem Relation Age of Onset  . Diabetes Mother   . Diabetes Father     Social History   Socioeconomic History  . Marital status: Single    Spouse name: Not on file  . Number of children: Not on file  . Years of education: Not on file  . Highest education level: Not on  file  Occupational History  . Not on file  Social Needs  . Financial resource strain: Not on file  . Food insecurity:    Worry: Not on file    Inability: Not on file  . Transportation needs:    Medical: Not on file    Non-medical: Not on file  Tobacco Use  . Smoking status: Never Smoker  . Smokeless tobacco: Never Used  Substance and Sexual Activity  . Alcohol use: No  . Drug use: No  . Sexual activity: Yes    Birth control/protection: Pill  Lifestyle  . Physical activity:    Days per week: Not on file    Minutes per session: Not on file  . Stress: Not on file  Relationships  . Social connections:    Talks on phone: Not on file    Gets together: Not on file    Attends religious service: Not on file    Active member of club or organization: Not on file    Attends meetings of clubs or organizations: Not on file    Relationship status: Not on file  . Intimate partner violence:    Fear of current or ex partner: Not on file    Emotionally abused: Not on file    Physically abused: Not on file    Forced sexual activity: Not on file  Other Topics Concern  . Not on file  Social History Narrative  . Not on file    Allergies  Allergen Reactions  . Penicillins Hives    Prior to Admission medications   Medication Sig Start Date End Date Taking? Authorizing Provider  metFORMIN (GLUCOPHAGE) 500 MG tablet Take 2 tablets (1,000 mg total) by mouth 2 (two) times daily with a meal. 03/02/18  Yes Will Bonnet, MD  norethindrone (MICRONOR,CAMILA,ERRIN) 0.35 MG tablet Take 1 tablet (0.35 mg total) by mouth daily. 03/02/18  Yes Will Bonnet, MD    Review of Systems  Constitutional: Negative.   HENT: Negative.   Eyes: Negative.   Respiratory: Negative.   Cardiovascular: Negative.   Gastrointestinal: Negative.   Genitourinary: Negative.   Musculoskeletal: Negative.   Skin: Negative.   Neurological: Negative.   Psychiatric/Behavioral: Negative.      Physical Exam BP  124/78   Ht 4\' 11"  (1.499 m)   Wt 235 lb (106.6 kg)   LMP 04/21/2019   BMI 47.46 kg/m  Patient's last menstrual period was 04/21/2019. Physical Exam Constitutional:      General: She is not in acute distress.    Appearance: Normal appearance. She is well-developed.  Genitourinary:     Pelvic exam was performed with patient in the lithotomy position.     Vulva, inguinal canal, urethra, bladder, vagina, uterus, right adnexa and left adnexa normal.     No posterior fourchette tenderness, injury or lesion present.     No cervical friability, lesion, bleeding or polyp.  HENT:     Head: Normocephalic and atraumatic.  Eyes:     General: No scleral icterus.    Conjunctiva/sclera: Conjunctivae normal.  Neck:     Musculoskeletal: Normal range of motion and neck supple.  Cardiovascular:     Rate and Rhythm: Normal rate and regular rhythm.     Heart sounds: No murmur. No friction rub. No gallop.   Pulmonary:     Effort: Pulmonary effort is normal. No respiratory distress.     Breath sounds: Normal breath sounds. No wheezing or rales.  Abdominal:     General: Bowel sounds are normal. There is no distension.     Palpations: Abdomen is soft. There is no mass.     Tenderness: There is no abdominal tenderness. There is no guarding or rebound.  Musculoskeletal: Normal range of motion.  Neurological:     General: No focal deficit present.     Mental Status: She is alert and oriented to person, place, and time.     Cranial Nerves: No cranial nerve deficit.  Skin:    General: Skin is warm and dry.     Findings: No erythema.     Comments: Acanthosis nigricans around neck  Psychiatric:        Mood and Affect: Mood normal.        Behavior: Behavior normal.        Judgment: Judgment normal.    Female chaperone present for pelvic and breast  portions of the physical exam  UPT: negative  Assessment: 34 y.o. G54P2002 female here for  1. Secondary oligomenorrhea   2. PCOS (polycystic ovarian  syndrome)   3. Pap smear for cervical cancer screening   4. Screen for STD (sexually transmitted disease)      Plan: Problem List Items Addressed This Visit      Endocrine   PCOS (polycystic ovarian syndrome)   Relevant Medications   metFORMIN (GLUCOPHAGE) 500 MG tablet  Other Relevant Orders   Hgb A1c w/o eAG (Completed)   US PELVIS TRANSVANGINAL NON-OB (TV ONLY)     Other   Secondary oligomenorrhea - Primary   Relevant Orders   Hgb A1c w/o eAG (Completed)   US PELVIS TRANSVANGINAL NON-OB (TV ONLY)   Cytology - PAP    Other Visit Diagnoses    Pap smear for cervical cancer screening       Relevant Orders   Cytology - PAP   Screen for STD (sexually transmitted disease)       Relevant Orders   Cytology - PAP     The change in her bleeding pattern is peculiar.  I do wonder whether she has been taking her norethindrone with regularity.  Her bleeding pattern would be consistent with oligomenorrhea, if she were not taking her norethindrone.  We will check a hemoglobin A1c and perform a Pap smear with STD screening.  On her last ultrasound she did have a small fibroid, which we will recheck with another pelvic ultrasound.  Treatment will be based on these results.  20 minutes spent in face to face discussion with > 50% spent in counseling,management, and coordination of care of her Secondary oligomenorrhea,  PCOS.   Prentice Docker, MD 05/04/2019 1:02 PM

## 2019-05-04 ENCOUNTER — Encounter: Payer: Self-pay | Admitting: Obstetrics and Gynecology

## 2019-05-04 LAB — CYTOLOGY - PAP
Chlamydia: NEGATIVE
Diagnosis: NEGATIVE
HPV: NOT DETECTED
Neisseria Gonorrhea: NEGATIVE

## 2019-05-04 LAB — HGB A1C W/O EAG: Hgb A1c MFr Bld: 6 % — ABNORMAL HIGH (ref 4.8–5.6)

## 2019-05-04 MED ORDER — METFORMIN HCL 500 MG PO TABS: 1000.0000 mg | ORAL_TABLET | Freq: Two times a day (BID) | ORAL | 11 refills | Status: DC

## 2019-05-11 ENCOUNTER — Ambulatory Visit (INDEPENDENT_AMBULATORY_CARE_PROVIDER_SITE_OTHER): Payer: BC Managed Care – PPO

## 2019-05-11 ENCOUNTER — Ambulatory Visit: Payer: BC Managed Care – PPO | Admitting: Obstetrics and Gynecology

## 2019-05-11 ENCOUNTER — Other Ambulatory Visit: Payer: Self-pay

## 2019-05-11 DIAGNOSIS — D251 Intramural leiomyoma of uterus: Secondary | ICD-10-CM

## 2019-05-11 DIAGNOSIS — E282 Polycystic ovarian syndrome: Secondary | ICD-10-CM | POA: Diagnosis not present

## 2019-05-11 DIAGNOSIS — N914 Secondary oligomenorrhea: Secondary | ICD-10-CM | POA: Diagnosis not present

## 2019-05-12 ENCOUNTER — Encounter: Payer: Self-pay | Admitting: Obstetrics and Gynecology

## 2019-05-12 ENCOUNTER — Other Ambulatory Visit: Payer: Self-pay

## 2019-05-12 ENCOUNTER — Ambulatory Visit (INDEPENDENT_AMBULATORY_CARE_PROVIDER_SITE_OTHER): Payer: BC Managed Care – PPO | Admitting: Obstetrics and Gynecology

## 2019-05-12 DIAGNOSIS — E282 Polycystic ovarian syndrome: Secondary | ICD-10-CM

## 2019-05-12 NOTE — Progress Notes (Signed)
Virtual Visit via Telephone Note  I connected with [name] on 05/12/19 at  3:50 PM EDT by telephone and verified that I am speaking with the correct person using two identifiers.   I discussed the limitations, risks, security and privacy concerns of performing an evaluation and management service by telephone and the availability of in person appointments. I also discussed with the patient that there may be a patient responsible charge related to this service. The patient expressed understanding and agreed to proceed.  The patient was at her grandmother's house I spoke with the patient from my  Office The names of people involved in this encounter were: [name] and Prentice Docker, MD.   History of Present Illness: Follow up ultrasound for heavy vaginal bleeding.    The ultrasound showed a retroverted uterus with a fibroid that measures only slightly larger than the last ultrasound she had one year ago.   Her bleeding has been better. She hasn't had any more bleeding since our last conversation.   Observations/Objective: Physical Exam could not be performed. Because of the COVID-19 outbreak this visit was performed over the phone and not in person.   US Pelvis Transvanginal Non-ob (tv Only)  Result Date: 05/12/2019 Patient Name: Julie Patterson DOB: 12/09/1985 MRN: [id number] ULTRASOUND REPORT Location: [facility] Date of Service: 05/11/2019 Indications: Bleeding Findings: The uterus is anteverted cervix, retroflexed uterus and measures 12.7 x 5.2 x 5.7cm. Echo texture is homogenous with evidence of focal masses. Within the uterus is one suspected fibroid measuring: Fibroid 1: 2.8 x 2.5 x 2.2cm (left/mid, intramural) The Endometrium measures 12.4 mm. Right Ovary measures 5.4 x 2.5 x 2.5 cm. It is normal in appearance. Left Ovary measures 4.8 x 2.2 x 3.1 cm. It is normal in appearance. Survey of the adnexa demonstrates no adnexal masses. There is no free fluid in the cul de  sac. Limited exam due to large body habitus. Impression: 1. Single intramural fibroid. Vita Barley, RT The ultrasound images and findings were reviewed by me and I agree with the above report. Prentice Docker, MD, [facility], [facility] 05/12/2019 4:28 PM     Assessment and Plan: 1. PCOS (polycystic ovarian syndrome)      Follow Up Instructions: Discussed ongoing treatment with a different medication.  She would prefer something that provides contraception.  We discussed either an intrauterine device or Nexplanon.  We discussed both in detail and she will decide and schedule a visit to have which ever she decides to use placed.   I discussed the assessment and treatment plan with the patient. The patient was provided an opportunity to ask questions and all were answered. The patient agreed with the plan and demonstrated an understanding of the instructions.   The patient was advised to call back or seek an in-person evaluation if the symptoms worsen or if the condition fails to improve as anticipated.  Return if symptoms worsen or fail to improve, possible placement of IUD vs Nexplanon.   I provided 18:42 minutes of non-face-to-face time during this encounter.  Prentice Docker, MD  [facility], [facility] 05/12/2019 4:48 PM

## 2019-06-25 ENCOUNTER — Other Ambulatory Visit: Payer: Self-pay

## 2019-06-25 DIAGNOSIS — Z20822 Contact with and (suspected) exposure to covid-19: Secondary | ICD-10-CM

## 2019-06-27 LAB — NOVEL CORONAVIRUS, NAA: SARS-CoV-2, NAA: NOT DETECTED

## 2019-07-08 ENCOUNTER — Ambulatory Visit: Payer: BC Managed Care – PPO | Admitting: Obstetrics and Gynecology

## 2019-11-03 ENCOUNTER — Ambulatory Visit (INDEPENDENT_AMBULATORY_CARE_PROVIDER_SITE_OTHER): Payer: BC Managed Care – PPO | Admitting: Obstetrics and Gynecology

## 2019-11-03 ENCOUNTER — Encounter: Payer: Self-pay | Admitting: Obstetrics and Gynecology

## 2019-11-03 ENCOUNTER — Other Ambulatory Visit: Payer: Self-pay

## 2019-11-03 VITALS — BP 140/100 | Ht 59.0 in | Wt 243.0 lb

## 2019-11-03 DIAGNOSIS — N914 Secondary oligomenorrhea: Secondary | ICD-10-CM | POA: Diagnosis not present

## 2019-11-03 DIAGNOSIS — N644 Mastodynia: Secondary | ICD-10-CM

## 2019-11-03 LAB — POCT URINE PREGNANCY: Preg Test, Ur: NEGATIVE

## 2019-11-03 NOTE — Progress Notes (Signed)
Center, [facility]   Chief Complaint  Patient presents with  . Breast exam    aching pain nonstop for the last three weeks on both breasts, no lumps noticed     HPI:      Ms. Julie Patterson is a 33 y.o. [id number] who LMP was No LMP recorded. (Menstrual status: Irregular Periods)., presents today for breast tenderness bilat for the past 3 wks. Feels achy, constant. Tried ibup a couple days ago with minimal change. Pt denies any prior trauma, erythema, nipple d/c, masses. No FH breast/ovar cancer. No prior mammo/u/s. Pt on camila OCPs with amenorrhea. No change in [facility]. Doesn't bleed but sometimes feels like she is going to have period. No caffeine use.  She is sex active, no pregnant.   Patient Active Problem List   Diagnosis Date Noted  . PCOS (polycystic ovarian syndrome) 03/02/2018  . Secondary oligomenorrhea 02/09/2018  . BMI 40.0-44.9, adult ([place]) 01/02/2016  . Status post cesarean section 01/02/2016  . History of cesarean delivery 12/12/2015    Past Surgical History:  Procedure Laterality Date  . APPENDECTOMY    . CESAREAN SECTION    . CESAREAN SECTION N/A 01/02/2016   Procedure: CESAREAN SECTION;  Surgeon: Will Bonnet, MD;  Location: [facility] ORS;  Service: Obstetrics;  Laterality: N/A;    Family History  Problem Relation Age of Onset  . Diabetes Mother   . Diabetes Father   . Breast cancer Neg Hx   . Ovarian cancer Neg Hx     Social History   Socioeconomic History  . Marital status: Single    Spouse name: Not on file  . Number of children: Not on file  . Years of education: Not on file  . Highest education level: Not on file  Occupational History  . Not on file  Social Needs  . Financial resource strain: Not on file  . Food insecurity    Worry: Not on file    Inability: Not on file  . Transportation needs    Medical: Not on file    Non-medical: Not on file  Tobacco Use  . Smoking status: Never Smoker  . Smokeless tobacco: Never Used   Substance and Sexual Activity  . Alcohol use: No  . Drug use: No  . Sexual activity: Yes    Birth control/protection: Pill  Lifestyle  . Physical activity    Days per week: Not on file    Minutes per session: Not on file  . Stress: Not on file  Relationships  . Social Herbalist on phone: Not on file    Gets together: Not on file    Attends religious service: Not on file    Active member of club or organization: Not on file    Attends meetings of clubs or organizations: Not on file    Relationship status: Not on file  . Intimate partner violence    Fear of current or ex partner: Not on file    Emotionally abused: Not on file    Physically abused: Not on file    Forced sexual activity: Not on file  Other Topics Concern  . Not on file  Social History Narrative  . Not on file    Outpatient Medications Prior to Visit  Medication Sig Dispense Refill  . norethindrone (MICRONOR,CAMILA,ERRIN) 0.35 MG tablet Take 1 tablet (0.35 mg total) by mouth daily. 1 Package 11  . metFORMIN (GLUCOPHAGE) 500 MG tablet Take 2  tablets (1,000 mg total) by mouth 2 (two) times daily with a meal. (Patient not taking: Reported on 11/03/2019) 120 tablet 11   No facility-administered medications prior to visit.       ROS:  Review of Systems  Constitutional: Negative for fatigue, fever and unexpected weight change.  Respiratory: Negative for cough, shortness of breath and wheezing.   Cardiovascular: Negative for chest pain, palpitations and leg swelling.  Gastrointestinal: Negative for blood in stool, constipation, diarrhea, nausea and vomiting.  Endocrine: Negative for cold intolerance, heat intolerance and polyuria.  Genitourinary: Negative for dyspareunia, dysuria, flank pain, frequency, genital sores, hematuria, menstrual problem, pelvic pain, urgency, vaginal bleeding, vaginal discharge and vaginal pain.  Musculoskeletal: Negative for back pain, joint swelling and myalgias.  Skin:  Negative for rash.  Neurological: Negative for dizziness, syncope, light-headedness, numbness and headaches.  Hematological: Negative for adenopathy.  Psychiatric/Behavioral: Negative for agitation, confusion, sleep disturbance and suicidal ideas. The patient is not nervous/anxious.    BREAST: tenderness   OBJECTIVE:   Vitals:  BP (!) 140/100   Ht 4\' 11"  (1.499 m)   Wt 243 lb (110.2 kg)   Breastfeeding No   BMI 49.08 kg/m   Physical Exam Vitals signs reviewed.  Neck:     Musculoskeletal: Normal range of motion.  Pulmonary:     Effort: Pulmonary effort is normal.  Chest:     Breasts: Breasts are symmetrical.        Right: No inverted nipple, mass, nipple discharge, skin change or tenderness.        Left: No inverted nipple, mass, nipple discharge, skin change or tenderness.     Comments: NT TO PALPATE BREASTS BILAT BUT ACHY ON INSIDE Musculoskeletal: Normal range of motion.  Skin:    General: Skin is warm and dry.  Neurological:     General: No focal deficit present.     Mental Status: She is alert and oriented to person, place, and time.     Cranial Nerves: No cranial nerve deficit.  Psychiatric:        Mood and Affect: Mood normal.        Behavior: Behavior normal.        Thought Content: Thought content normal.        Judgment: Judgment normal.     Results: Results for orders placed or performed in visit on 11/03/19 (from the past 24 hour(s))  POCT urine pregnancy     Status: Normal   Collection Time: 11/03/19  4:56 PM  Result Value Ref Range   Preg Test, Ur Negative Negative     Assessment/Plan: Bilateral mastodynia - Plan: POCT urine pregnancy; Neg UPT, neg exam. Question hormonal. See if sx improve when next menses due. No caffeine. Try Vit E 400 IU BID. F/u prn. If sx persist, will check mammo but no concern for breast cancer given sx, hx and exam. Reassured pt.   Secondary oligomenorrhea - Plan: POCT urine pregnancy; neg eval with Dr. Glennon Mac. Neg UPT.      Return if symptoms worsen or fail to improve.  Rilei Kravitz B. Saylor Murry, PA-C 11/03/2019 4:58 PM

## 2019-11-03 NOTE — Patient Instructions (Signed)
I value your feedback and entrusting us with your care. If you get a [facility] patient survey, I would appreciate you taking the time to let us know about your experience today. Thank you! 

## 2020-02-10 ENCOUNTER — Encounter: Payer: Self-pay | Admitting: Obstetrics and Gynecology

## 2020-02-10 ENCOUNTER — Other Ambulatory Visit: Payer: Self-pay

## 2020-02-10 ENCOUNTER — Other Ambulatory Visit: Payer: Self-pay | Admitting: Obstetrics and Gynecology

## 2020-02-10 ENCOUNTER — Ambulatory Visit (INDEPENDENT_AMBULATORY_CARE_PROVIDER_SITE_OTHER): Payer: BC Managed Care – PPO | Admitting: Obstetrics and Gynecology

## 2020-02-10 ENCOUNTER — Ambulatory Visit (INDEPENDENT_AMBULATORY_CARE_PROVIDER_SITE_OTHER): Payer: BC Managed Care – PPO

## 2020-02-10 VITALS — BP 132/80 | Ht 59.0 in | Wt 248.0 lb

## 2020-02-10 DIAGNOSIS — D251 Intramural leiomyoma of uterus: Secondary | ICD-10-CM

## 2020-02-10 DIAGNOSIS — N914 Secondary oligomenorrhea: Secondary | ICD-10-CM

## 2020-02-10 DIAGNOSIS — N760 Acute vaginitis: Secondary | ICD-10-CM | POA: Diagnosis not present

## 2020-02-10 DIAGNOSIS — Z113 Encounter for screening for infections with a predominantly sexual mode of transmission: Secondary | ICD-10-CM

## 2020-02-10 DIAGNOSIS — N939 Abnormal uterine and vaginal bleeding, unspecified: Secondary | ICD-10-CM

## 2020-02-10 DIAGNOSIS — E282 Polycystic ovarian syndrome: Secondary | ICD-10-CM

## 2020-02-10 DIAGNOSIS — N938 Other specified abnormal uterine and vaginal bleeding: Secondary | ICD-10-CM

## 2020-02-10 NOTE — Progress Notes (Signed)
Patient ID: Julie Patterson, female   DOB: 1986-05-25, 34 y.o.   MRN: [id number]  Reason for Consult: Menstrual Problem (Having bleeding every other day consistantly )   Referred by Center, [facility]  Subjective:     HPI:  Julie Patterson is a 34 y.o. female She can presents today with complaints of spotting and vaginal bleeding since last Wednesday.  She reports that she has not had a period in about 9 months.  She has not been breast-feeding and she is not taking birth control.  She is sexually active and she does not use methods to prevent pregnancy.  She denies any bleeding after intercourse.  She denies any abnormal vaginal discharge.  She denies any vulvar or vaginal itching and/or pain.  She reports that she has not been taking any birth control methods.  She was prescribed Camilla in the past but is not taking it.  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  She reports the Metformin upset her stomach.  Her hemoglobin A1c was 6.09 months ago.  She reports that she does have a history of PCOS.  And was told this in the past by Dr. Glennon Mac.  She has no history of thyroid disorders.    She reports that she has had more difficulty seeing at night.  She has an appointment with the ophthalmologist on April 3.  She does not currently wear glasses but feels like she needs them.  She generally follows up with optometrist every year.  She estimates that she has had lifelong obesity.  Gynecological History Menarche: 12 Menopause: not applicable LMP: 0000000 Reports that she has had 15 years of irregular periods.  Last pap smear: 2020 NIL History of STDs: Denies Sexually Active: Yes  She has a history of uterine fibroids.  She does not have a history of polyps or ovarian cyst or endometriosis.  She has no family history of breast uterine or ovarian or ovarian cancer.  Obstetrical History She reports that she has 2 children who  are  Past Medical History:  Diagnosis Date  . Asthma    childhood asthma  . Diabetes mellitus without complication (HCC)    borderline  . GERD (gastroesophageal reflux disease)   . History of cesarean delivery 12/12/2015  . Hypertension    Family History  Problem Relation Age of Onset  . Diabetes Mother   . Diabetes Father   . Breast cancer Neg Hx   . Ovarian cancer Neg Hx    Past Surgical History:  Procedure Laterality Date  . APPENDECTOMY    . CESAREAN SECTION    . CESAREAN SECTION N/A 01/02/2016   Procedure: CESAREAN SECTION;  Surgeon: Will Bonnet, MD;  Location: [facility] ORS;  Service: Obstetrics;  Laterality: N/A;    Short Social History:  Social History   Tobacco Use  . Smoking status: Never Smoker  . Smokeless tobacco: Never Used  Substance Use Topics  . Alcohol use: No    Allergies  Allergen Reactions  . Penicillins Hives    Current Outpatient Medications  Medication Sig Dispense Refill  . metFORMIN (GLUCOPHAGE) 500 MG tablet Take 2 tablets (1,000 mg total) by mouth 2 (two) times daily with a meal. (Patient not taking: Reported on 11/03/2019) 120 tablet 11  . norethindrone (MICRONOR,CAMILA,ERRIN) 0.35 MG tablet Take 1 tablet (0.35 mg total) by mouth daily. (Patient not taking: Reported on 02/10/2020) 1 Package 11   No current  facility-administered medications for this visit.    Review of Systems  Constitutional: Negative for chills, fatigue, fever and unexpected weight change.  HENT: Negative for trouble swallowing.  Eyes: Negative for loss of vision.  Respiratory: Negative for cough, shortness of breath and wheezing.  Cardiovascular: Negative for chest pain, leg swelling, palpitations and syncope.  GI: Negative for abdominal pain, blood in stool, diarrhea, nausea and vomiting.  GU: Negative for difficulty urinating, dysuria, frequency and hematuria.  Musculoskeletal: Negative for back pain, leg pain and joint pain.  Skin: Negative for rash.    Neurological: Negative for dizziness, headaches, light-headedness, numbness and seizures.  Psychiatric: Negative for behavioral problem, confusion, depressed mood and sleep disturbance.        Objective:  Objective   Vitals:   02/10/20 0926  BP: 132/80  Weight: 248 lb (112.5 kg)  Height: 4\' 11"  (1.499 m)   Body mass index is 50.09 kg/m.  Physical Exam Vitals and nursing note reviewed.  Constitutional:      Appearance: She is well-developed.  HENT:     Head: Normocephalic and atraumatic.  Eyes:     Pupils: Pupils are equal, round, and reactive to light.  Cardiovascular:     Rate and Rhythm: Normal rate and regular rhythm.  Pulmonary:     Effort: Pulmonary effort is normal. No respiratory distress.  Genitourinary:    Comments: External: Vulva normal. No lesions noted.  Speculum examination:  Cervix anterior, grossly normal. . Scant pink discharge in the vaginal vault.  Bimanual examination: Uterus midline, enlarged approximately 10-12 cm, non-tender, irregular contour.  No CMT. Adnexa normal. Pelvis not fixed.  Skin:    General: Skin is warm and dry.  Neurological:     Mental Status: She is alert and oriented to person, place, and time.  Psychiatric:        Behavior: Behavior normal.        Thought Content: Thought content normal.        Judgment: Judgment normal.        Assessment/Plan:     34 yo with abnormal uterine bleeding 1. AUB- will obtain labs, prolactin, TSH, CBC. Pelvic US today shows irregular thickened endometrium, enlarged uterus, and intramural fibroid. Will plan hysteroscopy dilation and curettage to sample the endometrium and evaluate uterine cavity. Irregular thickening of endometrium suggestive of endometrial polyp.   More than 30 minutes were spent face to face with the patient in the room, reviewing the medical record, labs and images, and coordinating care for the patient. The plan of management was discussed in detail and counseling was  provided.   Adrian Prows MD [facility] 02/10/2020 9:58 AM

## 2020-02-11 LAB — CBC
Hematocrit: 42.2 % (ref 34.0–46.6)
Hemoglobin: 13.9 g/dL (ref 11.1–15.9)
MCH: 26.1 pg — ABNORMAL LOW (ref 26.6–33.0)
MCHC: 32.9 g/dL (ref 31.5–35.7)
MCV: 79 fL (ref 79–97)
Platelets: 301 10*3/uL (ref 150–450)
RBC: 5.32 x10E6/uL — ABNORMAL HIGH (ref 3.77–5.28)
RDW: 12.9 % (ref 11.7–15.4)
WBC: 4.7 10*3/uL (ref 3.4–10.8)

## 2020-02-11 LAB — TSH+FREE T4
Free T4: 1.04 ng/dL (ref 0.82–1.77)
TSH: 1.67 u[IU]/mL (ref 0.450–4.500)

## 2020-02-11 LAB — PROLACTIN: Prolactin: 9.2 ng/mL (ref 4.8–23.3)

## 2020-02-13 LAB — NUSWAB VAGINITIS PLUS (VG+)
Atopobium vaginae: HIGH Score — AB
Candida albicans, NAA: NEGATIVE
Candida glabrata, NAA: NEGATIVE
Chlamydia trachomatis, NAA: NEGATIVE
Neisseria gonorrhoeae, NAA: NEGATIVE
Trich vag by NAA: NEGATIVE

## 2020-02-14 ENCOUNTER — Telehealth: Payer: Self-pay | Admitting: Obstetrics and Gynecology

## 2020-02-14 NOTE — Telephone Encounter (Signed)
Returned call and discussed with patient

## 2020-02-14 NOTE — Telephone Encounter (Signed)
Patient is calling for labs results. Please advise. 

## 2020-02-15 ENCOUNTER — Telehealth: Payer: Self-pay | Admitting: Obstetrics and Gynecology

## 2020-02-15 NOTE — Telephone Encounter (Signed)
Pt called to sch surgery DOS 4/13 w Dr Gilman Schmidt  H&P 4/1 @ 4:10  Covid 4/9 8-10:30 am, [address] drive up and wear mask. Adv pt to quar until DOS  Adv of Pre-admit appt and that it will be listed with in her MyChart (conf use)  May also rec calls from [employer] pre-service and pharmacy  [employer] is [facility]

## 2020-02-15 NOTE — Telephone Encounter (Signed)
Patient following up on scheduling procedure.

## 2020-02-22 ENCOUNTER — Encounter: Payer: Self-pay | Admitting: Obstetrics and Gynecology

## 2020-02-24 ENCOUNTER — Encounter: Payer: Self-pay | Admitting: Obstetrics and Gynecology

## 2020-02-24 ENCOUNTER — Ambulatory Visit (INDEPENDENT_AMBULATORY_CARE_PROVIDER_SITE_OTHER): Payer: BC Managed Care – PPO | Admitting: Obstetrics and Gynecology

## 2020-02-24 ENCOUNTER — Other Ambulatory Visit: Payer: Self-pay

## 2020-02-24 VITALS — BP 130/80 | Ht 59.0 in | Wt 245.0 lb

## 2020-02-24 DIAGNOSIS — N938 Other specified abnormal uterine and vaginal bleeding: Secondary | ICD-10-CM

## 2020-02-24 DIAGNOSIS — N852 Hypertrophy of uterus: Secondary | ICD-10-CM

## 2020-02-24 DIAGNOSIS — N840 Polyp of corpus uteri: Secondary | ICD-10-CM

## 2020-02-24 DIAGNOSIS — Z01818 Encounter for other preprocedural examination: Secondary | ICD-10-CM

## 2020-02-24 DIAGNOSIS — N939 Abnormal uterine and vaginal bleeding, unspecified: Secondary | ICD-10-CM

## 2020-02-24 NOTE — Progress Notes (Signed)
Patient ID: Julie Patterson, female   DOB: 08/04/1986, 34 y.o.   MRN: [id number]  Reason for Consult: Pre-op Exam   [facility]erred by [facility]  Subjective:     HPI:  Julie Patterson is a 34 y.o. female. She is here for a preoperative appointment for a hysteroscopy D&C which was planned. She has been having issues with amenorrhea and vaginal spotting. Recent US showed thickened endometrium and possible endometrial polyp.    Past Medical History:  Diagnosis Date  . Asthma    childhood asthma  . Diabetes mellitus without complication (HCC)    borderline  . GERD (gastroesophageal reflux disease)   . History of cesarean delivery 12/12/2015  . Hypertension    Family History  Problem Relation Age of Onset  . Diabetes Mother   . Diabetes Father   . Breast cancer Neg Hx   . Ovarian cancer Neg Hx    Past Surgical History:  Procedure Laterality Date  . APPENDECTOMY    . CESAREAN SECTION    . CESAREAN SECTION N/A 01/02/2016   Procedure: CESAREAN SECTION;  Surgeon: Will Bonnet, MD;  Location: [facility] ORS;  Service: Obstetrics;  Laterality: N/A;    Short Social History:  Social History   Tobacco Use  . Smoking status: Never Smoker  . Smokeless tobacco: Never Used  Substance Use Topics  . Alcohol use: No    Allergies  Allergen Reactions  . Penicillins Hives    Did it involve swelling of the face/tongue/throat, SOB, or low BP? No Did it involve sudden or severe rash/hives, skin peeling, or any reaction on the inside of your mouth or nose? No Did you need to seek medical attention at a hospital or doctor's office? No When did it last happen?2017 If all above answers are "NO", may proceed with cephalosporin use.     No current outpatient medications on file.   No current facility-administered medications for this visit.    Review of Systems  Constitutional: Negative for chills, fatigue, fever and unexpected weight change.  HENT: Negative for trouble  swallowing.  Eyes: Negative for loss of vision.  Respiratory: Negative for cough, shortness of breath and wheezing.  Cardiovascular: Negative for chest pain, leg swelling, palpitations and syncope.  GI: Negative for abdominal pain, blood in stool, diarrhea, nausea and vomiting.  GU: Negative for difficulty urinating, dysuria, frequency and hematuria.  Musculoskeletal: Negative for back pain, leg pain and joint pain.  Skin: Negative for rash.  Neurological: Negative for dizziness, headaches, light-headedness, numbness and seizures.  Psychiatric: Negative for behavioral problem, confusion, depressed mood and sleep disturbance.        Objective:  Objective   Vitals:   02/24/20 1610  BP: 130/80  Weight: 245 lb (111.1 kg)  Height: 4\' 11"  (1.499 m)   Body mass index is 49.48 kg/m.  Physical Exam Vitals and nursing note reviewed.  Constitutional:      Appearance: She is well-developed.  HENT:     Head: Normocephalic and atraumatic.  Eyes:     Pupils: Pupils are equal, round, and reactive to light.  Cardiovascular:     Rate and Rhythm: Normal rate and regular rhythm.  Pulmonary:     Effort: Pulmonary effort is normal. No respiratory distress.  Abdominal:     General: Abdomen is flat.     Palpations: Abdomen is soft.  Skin:    General: Skin is warm and dry.  Neurological:     Mental Status: She is  alert and oriented to person, place, and time.  Psychiatric:        Behavior: Behavior normal.        Thought Content: Thought content normal.        Judgment: Judgment normal.        Assessment/Plan:     34 yo with thickened endometrium, possible endometrial polyp.  Discussed risks and benefits of the hysteroscopy with the patient in detail. She understands the risks of infection, bleeding, and damage to surrounding pelvic organs. She desires to proceed with the procedure.   More than 20 minutes were spent face to face with the patient in the room, reviewing the medical  record, labs and images, and coordinating care for the patient. The plan of management was discussed in detail and counseling was provided.     Adrian Prows MD [facility], [facility] 02/24/2020 5:36 PM

## 2020-02-24 NOTE — H&P (View-Only) (Signed)
Patient ID: Julie Patterson, female   DOB: 10-14-1986, 34 y.o.   MRN: [id number]  Reason for Consult: Pre-op Exam   [facility] by [facility]  Subjective:     HPI:  Julie Patterson is a 34 y.o. female. She is here for a preoperative appointment for a hysteroscopy D&C which was planned. She has been having issues with amenorrhea and vaginal spotting. Recent US showed thickened endometrium and possible endometrial polyp.    Past Medical History:  Diagnosis Date  . Asthma    childhood asthma  . Diabetes mellitus without complication (HCC)    borderline  . GERD (gastroesophageal reflux disease)   . History of cesarean delivery 12/12/2015  . Hypertension    Family History  Problem Relation Age of Onset  . Diabetes Mother   . Diabetes Father   . Breast cancer Neg Hx   . Ovarian cancer Neg Hx    Past Surgical History:  Procedure Laterality Date  . APPENDECTOMY    . CESAREAN SECTION    . CESAREAN SECTION N/A 01/02/2016   Procedure: CESAREAN SECTION;  Surgeon: Will Bonnet, MD;  Location: [facility] ORS;  Service: Obstetrics;  Laterality: N/A;    Short Social History:  Social History   Tobacco Use  . Smoking status: Never Smoker  . Smokeless tobacco: Never Used  Substance Use Topics  . Alcohol use: No    Allergies  Allergen Reactions  . Penicillins Hives    Did it involve swelling of the face/tongue/throat, SOB, or low BP? No Did it involve sudden or severe rash/hives, skin peeling, or any reaction on the inside of your mouth or nose? No Did you need to seek medical attention at a hospital or doctor's office? No When did it last happen?2017 If all above answers are "NO", may proceed with cephalosporin use.     No current outpatient medications on file.   No current facility-administered medications for this visit.    Review of Systems  Constitutional: Negative for chills, fatigue, fever and unexpected weight change.  HENT: Negative for trouble  swallowing.  Eyes: Negative for loss of vision.  Respiratory: Negative for cough, shortness of breath and wheezing.  Cardiovascular: Negative for chest pain, leg swelling, palpitations and syncope.  GI: Negative for abdominal pain, blood in stool, diarrhea, nausea and vomiting.  GU: Negative for difficulty urinating, dysuria, frequency and hematuria.  Musculoskeletal: Negative for back pain, leg pain and joint pain.  Skin: Negative for rash.  Neurological: Negative for dizziness, headaches, light-headedness, numbness and seizures.  Psychiatric: Negative for behavioral problem, confusion, depressed mood and sleep disturbance.        Objective:  Objective   Vitals:   02/24/20 1610  BP: 130/80  Weight: 245 lb (111.1 kg)  Height: 4\' 11"  (1.499 m)   Body mass index is 49.48 kg/m.  Physical Exam Vitals and nursing note reviewed.  Constitutional:      Appearance: She is well-developed.  HENT:     Head: Normocephalic and atraumatic.  Eyes:     Pupils: Pupils are equal, round, and reactive to light.  Cardiovascular:     Rate and Rhythm: Normal rate and regular rhythm.  Pulmonary:     Effort: Pulmonary effort is normal. No respiratory distress.  Abdominal:     General: Abdomen is flat.     Palpations: Abdomen is soft.  Skin:    General: Skin is warm and dry.  Neurological:     Mental Status: She is  alert and oriented to person, place, and time.  Psychiatric:        Behavior: Behavior normal.        Thought Content: Thought content normal.        Judgment: Judgment normal.        Assessment/Plan:     34 yo with thickened endometrium, possible endometrial polyp.  Discussed risks and benefits of the hysteroscopy with the patient in detail. She understands the risks of infection, bleeding, and damage to surrounding pelvic organs. She desires to proceed with the procedure.   More than 20 minutes were spent face to face with the patient in the room, reviewing the medical  record, labs and images, and coordinating care for the patient. The plan of management was discussed in detail and counseling was provided.     Adrian Prows MD [facility], [facility] 02/24/2020 5:36 PM

## 2020-02-25 ENCOUNTER — Encounter
Admission: RE | Admit: 2020-02-25 | Discharge: 2020-02-25 | Disposition: A | Discharge status: Home / Self Care | Payer: BC Managed Care – PPO | Source: Ambulatory Visit | Attending: Obstetrics and Gynecology | Admitting: Obstetrics and Gynecology

## 2020-02-25 NOTE — Patient Instructions (Signed)
Your procedure is scheduled on: Tues 4/13 Report to Day Surgery.  Medical Julie Patterson To find out your arrival time please call [phone] between 1PM - 3PM on  Mon. 4/12  Remember: Instructions that are not followed completely may result in serious medical risk,  up to and including death, or upon the discretion of your surgeon and anesthesiologist your  surgery may need to be rescheduled.     _X__ 1. Do not eat food after midnight the night before your procedure.                 No gum chewing or hard candies. You may drink clear liquids up to 2 hours                 before you are scheduled to arrive for your surgery- DO not drink clear                 liquids within 2 hours of the start of your surgery.                 Clear Liquids include:  water, apple juice without pulp, clear Gatorade, G2 or                  Gatorade Zero (avoid Red/Purple/Blue), Black Coffee or Tea (Do not add                 anything to coffee or tea). _____2.   Complete the carbohydrate drink provided to you, 2 hours before arrival.  __X__2.  On the morning of surgery brush your teeth with toothpaste and water, you                may rinse your mouth with mouthwash if you wish.  Do not swallow any toothpaste of mouthwash.     ___ 3.  No Alcohol for 24 hours before or after surgery.   ___ 4.  Do Not Smoke or use e-cigarettes For 24 Hours Prior to Your Surgery.                 Do not use any chewable tobacco products for at least 6 hours prior to                 surgery.  ____  5.  Bring all medications with you on the day of surgery if instructed.   __x__  6.  Notify your doctor if there is any change in your medical condition      (cold, fever, infections).     Do not wear jewelry, make-up, hairpins, clips or nail polish. Do not wear lotions, powders, or perfumes. You may wear deodorant. Do not shave 48 hours prior to surgery. Men may shave face and neck. Do not bring valuables to the  hospital.    [facility] is not responsible for any belongings or valuables.  Contacts, dentures or bridgework may not be worn into surgery. Leave your suitcase in the car. After surgery it may be brought to your room. For patients admitted to the hospital, discharge time is determined by your treatment team.   Patients discharged the day of surgery will not be allowed to drive home.   Make arrangements for someone to be with you for the first 24 hours of your Same Day Discharge.    Please read over the following fact sheets that you were given:    ____ Take these medicines the morning of surgery with A SIP OF  WATER:    1. none  2.   3.   4.  5.  6.  ____ Fleet Enema (as directed)   ____ Use CHG Soap (or wipes) as directed  ____ Use Benzoyl Peroxide Gel as instructed  ____ Use inhalers on the day of surgery  ____ Stop metformin 2 days prior to surgery    ____ Take 1/2 of usual insulin dose the night before surgery. No insulin the morning          of surgery.   ____ Stop Coumadin/Plavix/aspirin    __x__ Stop Anti-inflammatories no ibuprofen aleve or aspirin until after the surgery    May take tylenol   ____ Stop supplements until after surgery.    ____ Bring C-Pap to the hospital.

## 2020-03-03 ENCOUNTER — Other Ambulatory Visit
Admission: RE | Admit: 2020-03-03 | Discharge: 2020-03-03 | Disposition: A | Discharge status: Home / Self Care | Payer: BC Managed Care – PPO | Source: Ambulatory Visit | Attending: Obstetrics and Gynecology | Admitting: Obstetrics and Gynecology

## 2020-03-03 DIAGNOSIS — Z01812 Encounter for preprocedural laboratory examination: Secondary | ICD-10-CM | POA: Insufficient documentation

## 2020-03-03 DIAGNOSIS — Z20822 Contact with and (suspected) exposure to covid-19: Secondary | ICD-10-CM | POA: Insufficient documentation

## 2020-03-03 LAB — CBC
HCT: 40.9 % (ref 36.0–46.0)
Hemoglobin: 13 g/dL (ref 12.0–15.0)
MCH: 26.3 pg (ref 26.0–34.0)
MCHC: 31.8 g/dL (ref 30.0–36.0)
MCV: 82.8 fL (ref 80.0–100.0)
Platelets: 319 10*3/uL (ref 150–400)
RBC: 4.94 MIL/uL (ref 3.87–5.11)
RDW: 12.7 % (ref 11.5–15.5)
WBC: 6.4 10*3/uL (ref 4.0–10.5)
nRBC: 0 % (ref 0.0–0.2)

## 2020-03-03 LAB — TYPE AND SCREEN
ABO/RH(D): O POS
Antibody Screen: NEGATIVE

## 2020-03-03 LAB — SARS CORONAVIRUS 2 (TAT 6-24 HRS): SARS Coronavirus 2: NEGATIVE

## 2020-03-06 ENCOUNTER — Encounter: Payer: Self-pay | Admitting: Obstetrics and Gynecology

## 2020-03-07 ENCOUNTER — Ambulatory Visit: Payer: BC Managed Care – PPO

## 2020-03-07 ENCOUNTER — Encounter: Payer: Self-pay | Admitting: Obstetrics and Gynecology

## 2020-03-07 ENCOUNTER — Ambulatory Visit
Admission: RE | Admit: 2020-03-07 | Discharge: 2020-03-07 | Disposition: A | Discharge status: Home / Self Care | Payer: BC Managed Care – PPO | Source: Ambulatory Visit | Attending: Obstetrics and Gynecology | Admitting: Obstetrics and Gynecology

## 2020-03-07 ENCOUNTER — Encounter
Admission: RE | Disposition: A | Discharge status: Home / Self Care | Payer: Self-pay | Source: Ambulatory Visit | Attending: Obstetrics and Gynecology

## 2020-03-07 ENCOUNTER — Other Ambulatory Visit: Payer: Self-pay

## 2020-03-07 DIAGNOSIS — R7303 Prediabetes: Secondary | ICD-10-CM | POA: Diagnosis not present

## 2020-03-07 DIAGNOSIS — Z6841 Body Mass Index (BMI) 40.0 and over, adult: Secondary | ICD-10-CM | POA: Insufficient documentation

## 2020-03-07 DIAGNOSIS — N938 Other specified abnormal uterine and vaginal bleeding: Secondary | ICD-10-CM | POA: Diagnosis not present

## 2020-03-07 DIAGNOSIS — I10 Essential (primary) hypertension: Secondary | ICD-10-CM | POA: Insufficient documentation

## 2020-03-07 DIAGNOSIS — N840 Polyp of corpus uteri: Secondary | ICD-10-CM | POA: Diagnosis not present

## 2020-03-07 DIAGNOSIS — N939 Abnormal uterine and vaginal bleeding, unspecified: Secondary | ICD-10-CM | POA: Diagnosis present

## 2020-03-07 HISTORY — PX: HYSTEROSCOPY WITH D & C: SHX1775

## 2020-03-07 LAB — POCT PREGNANCY, URINE: Preg Test, Ur: NEGATIVE

## 2020-03-07 LAB — GLUCOSE, CAPILLARY
Glucose-Capillary: 108 mg/dL — ABNORMAL HIGH (ref 70–99)
Glucose-Capillary: 109 mg/dL — ABNORMAL HIGH (ref 70–99)

## 2020-03-07 SURGERY — DILATATION AND CURETTAGE /HYSTEROSCOPY
Anesthesia: General | Site: Uterus

## 2020-03-07 MED ORDER — KETOROLAC TROMETHAMINE 30 MG/ML IJ SOLN
INTRAMUSCULAR | Status: DC | PRN
  Administered 2020-03-07: 30 mg via INTRAVENOUS

## 2020-03-07 MED ORDER — MIDAZOLAM HCL 2 MG/2ML IJ SOLN
INTRAMUSCULAR | Status: DC | PRN
  Administered 2020-03-07: 2 mg via INTRAVENOUS

## 2020-03-07 MED ORDER — ONDANSETRON HCL 4 MG/2ML IJ SOLN
INTRAMUSCULAR | Status: DC | PRN
  Administered 2020-03-07: 4 mg via INTRAVENOUS

## 2020-03-07 MED ORDER — PHENYLEPHRINE HCL (PRESSORS) 10 MG/ML IV SOLN
INTRAVENOUS | Status: DC | PRN
  Administered 2020-03-07 (×2): 100 ug via INTRAVENOUS

## 2020-03-07 MED ORDER — ONDANSETRON HCL 4 MG/2ML IJ SOLN
INTRAMUSCULAR | Status: AC
  Filled 2020-03-07: qty 2

## 2020-03-07 MED ORDER — LIDOCAINE HCL (CARDIAC) PF 100 MG/5ML IV SOSY
PREFILLED_SYRINGE | INTRAVENOUS | Status: DC | PRN
  Administered 2020-03-07: 60 mg via INTRAVENOUS

## 2020-03-07 MED ORDER — FENTANYL CITRATE (PF) 100 MCG/2ML IJ SOLN: 25.0000 ug | INTRAMUSCULAR | Status: DC | PRN

## 2020-03-07 MED ORDER — LACTATED RINGERS IV SOLN: INTRAVENOUS | Status: DC

## 2020-03-07 MED ORDER — SODIUM CHLORIDE 0.9 % IR SOLN
Status: DC | PRN
  Administered 2020-03-07 (×2): 3000 mL

## 2020-03-07 MED ORDER — FAMOTIDINE 20 MG PO TABS
20.0000 mg | ORAL_TABLET | Freq: Once | ORAL | Status: AC
  Administered 2020-03-07: 20 mg via ORAL

## 2020-03-07 MED ORDER — DIPHENHYDRAMINE HCL 50 MG/ML IJ SOLN
INTRAMUSCULAR | Status: AC
  Filled 2020-03-07: qty 1

## 2020-03-07 MED ORDER — OXYCODONE HCL 5 MG/5ML PO SOLN: 5.0000 mg | Freq: Once | ORAL | Status: DC | PRN

## 2020-03-07 MED ORDER — ACETAMINOPHEN 10 MG/ML IV SOLN: 1000.0000 mg | Freq: Once | INTRAVENOUS | Status: DC | PRN

## 2020-03-07 MED ORDER — SUCCINYLCHOLINE CHLORIDE 20 MG/ML IJ SOLN
INTRAMUSCULAR | Status: DC | PRN
  Administered 2020-03-07: 140 mg via INTRAVENOUS

## 2020-03-07 MED ORDER — LIDOCAINE HCL (PF) 2 % IJ SOLN
INTRAMUSCULAR | Status: AC
  Filled 2020-03-07: qty 5

## 2020-03-07 MED ORDER — DEXAMETHASONE SODIUM PHOSPHATE 10 MG/ML IJ SOLN
INTRAMUSCULAR | Status: AC
  Filled 2020-03-07: qty 1

## 2020-03-07 MED ORDER — FENTANYL CITRATE (PF) 100 MCG/2ML IJ SOLN
INTRAMUSCULAR | Status: AC
  Filled 2020-03-07: qty 2

## 2020-03-07 MED ORDER — MIDAZOLAM HCL 2 MG/2ML IJ SOLN
INTRAMUSCULAR | Status: AC
  Filled 2020-03-07: qty 2

## 2020-03-07 MED ORDER — IBUPROFEN 800 MG PO TABS: 800.0000 mg | ORAL_TABLET | Freq: Three times a day (TID) | ORAL | 0 refills | Status: DC | PRN

## 2020-03-07 MED ORDER — PROPOFOL 500 MG/50ML IV EMUL
INTRAVENOUS | Status: AC
  Filled 2020-03-07: qty 50

## 2020-03-07 MED ORDER — DEXAMETHASONE SODIUM PHOSPHATE 10 MG/ML IJ SOLN
INTRAMUSCULAR | Status: DC | PRN
  Administered 2020-03-07: 4 mg via INTRAVENOUS

## 2020-03-07 MED ORDER — PROPOFOL 10 MG/ML IV BOLUS
INTRAVENOUS | Status: DC | PRN
  Administered 2020-03-07: 180 mg via INTRAVENOUS

## 2020-03-07 MED ORDER — KETOROLAC TROMETHAMINE 30 MG/ML IJ SOLN
INTRAMUSCULAR | Status: AC
  Filled 2020-03-07: qty 1

## 2020-03-07 MED ORDER — SUCCINYLCHOLINE CHLORIDE 200 MG/10ML IV SOSY
PREFILLED_SYRINGE | INTRAVENOUS | Status: AC
  Filled 2020-03-07: qty 10

## 2020-03-07 MED ORDER — FAMOTIDINE 20 MG PO TABS
ORAL_TABLET | ORAL | Status: AC
  Filled 2020-03-07: qty 1

## 2020-03-07 MED ORDER — OXYCODONE HCL 5 MG PO TABS: 5.0000 mg | ORAL_TABLET | Freq: Once | ORAL | Status: DC | PRN

## 2020-03-07 MED ORDER — DIPHENHYDRAMINE HCL 50 MG/ML IJ SOLN
INTRAMUSCULAR | Status: DC | PRN
  Administered 2020-03-07: 12.5 mg via INTRAVENOUS

## 2020-03-07 MED ORDER — FENTANYL CITRATE (PF) 100 MCG/2ML IJ SOLN
INTRAMUSCULAR | Status: DC | PRN
  Administered 2020-03-07 (×2): 50 ug via INTRAVENOUS

## 2020-03-07 MED ORDER — ONDANSETRON HCL 4 MG/2ML IJ SOLN: 4.0000 mg | Freq: Once | INTRAMUSCULAR | Status: DC | PRN

## 2020-03-07 SURGICAL SUPPLY — 19 items
CATH ROBINSON RED A/P 16FR (CATHETERS) ×3 IMPLANT
DEVICE MYOSURE LITE (MISCELLANEOUS) ×2 IMPLANT
DEVICE MYOSURE REACH (MISCELLANEOUS) IMPLANT
ELECT REM PT RETURN 9FT ADLT (ELECTROSURGICAL)
ELECTRODE REM PT RTRN 9FT ADLT (ELECTROSURGICAL) IMPLANT
GLOVE BIOGEL PI IND STRL 6.5 (GLOVE) ×2 IMPLANT
GLOVE BIOGEL PI INDICATOR 6.5 (GLOVE) ×4
GLOVE SURG SYN 6.5 ES PF (GLOVE) ×3 IMPLANT
GLOVE SURG SYN 6.5 PF PI (GLOVE) ×1 IMPLANT
GOWN STRL REUS W/ TWL LRG LVL3 (GOWN DISPOSABLE) ×2 IMPLANT
GOWN STRL REUS W/TWL LRG LVL3 (GOWN DISPOSABLE) ×4
KIT PROCEDURE FLUENT (KITS) IMPLANT
PACK DNC HYST (MISCELLANEOUS) ×3 IMPLANT
PAD OB MATERNITY 4.3X12.25 (PERSONAL CARE ITEMS) ×3 IMPLANT
PAD PREP 24X41 OB/GYN DISP (PERSONAL CARE ITEMS) ×3 IMPLANT
SEAL ROD LENS SCOPE MYOSURE (ABLATOR) ×3 IMPLANT
SOL .9 NS 3000ML IRR  AL (IV SOLUTION) ×2
SOL .9 NS 3000ML IRR UROMATIC (IV SOLUTION) ×1 IMPLANT
TOWEL OR 17X26 4PK STRL BLUE (TOWEL DISPOSABLE) ×3 IMPLANT

## 2020-03-07 NOTE — Anesthesia Postprocedure Evaluation (Signed)
Anesthesia Post Note  Patient: Julie Patterson  Procedure(s) Performed: DILATATION AND CURETTAGE, HYSTEROSCOPY WITH MYOSURE, POLYPECTOMY (N/A Uterus)  Patient location during evaluation: PACU Anesthesia Type: General Level of consciousness: awake and alert Pain management: pain level controlled Vital Signs Assessment: post-procedure vital signs reviewed and stable Respiratory status: spontaneous breathing, nonlabored ventilation, respiratory function stable and patient connected to nasal cannula oxygen Cardiovascular status: blood pressure returned to baseline and stable Postop Assessment: no apparent nausea or vomiting Anesthetic complications: no     Last Vitals:  Vitals:   03/07/20 1037 03/07/20 1052  BP: 121/63   Pulse: 79 72  Resp: (!) 21 19  Temp:  36.8 C  SpO2: 97% 97%    Last Pain:  Vitals:   03/07/20 1052  TempSrc:   PainSc: 0-No pain                 Arita Miss

## 2020-03-07 NOTE — Anesthesia Procedure Notes (Signed)
Procedure Name: Intubation Performed by: Cory Munch, RN Pre-anesthesia Checklist: Patient identified, Patient being monitored, Timeout performed, Emergency Drugs available and Suction available Patient Re-evaluated:Patient Re-evaluated prior to induction Oxygen Delivery Method: Circle system utilized Preoxygenation: Pre-oxygenation with 100% oxygen Induction Type: IV induction Ventilation: Mask ventilation without difficulty Laryngoscope Size: Mac and 3 Grade View: Grade II Tube type: Oral Tube size: 7.0 mm Number of attempts: 1 Airway Equipment and Method: Stylet Placement Confirmation: ETT inserted through vocal cords under direct vision,  positive ETCO2 and breath sounds checked- equal and bilateral Secured at: 21 cm Tube secured with: Tape Dental Injury: Teeth and Oropharynx as per pre-operative assessment

## 2020-03-07 NOTE — Transfer of Care (Signed)
Immediate Anesthesia Transfer of Care Note  Patient: Julie Patterson  Procedure(s) Performed: DILATATION AND CURETTAGE, HYSTEROSCOPY WITH MYOSURE, POLYPECTOMY (N/A Uterus)  Patient Location: PACU  Anesthesia Type:General  Level of Consciousness: sedated and drowsy  Airway & Oxygen Therapy: Patient Spontanous Breathing and Patient connected to face mask oxygen  Post-op Assessment: Report given to RN and Post -op Vital signs reviewed and stable  Post vital signs: Reviewed and stable  Last Vitals:  Vitals Value Taken Time  BP 113/46 03/07/20 1007  Temp    Pulse 94 03/07/20 1009  Resp    SpO2 100 % 03/07/20 1009  Vitals shown include unvalidated device data.  Last Pain:  Vitals:   03/07/20 0808  TempSrc: Temporal  PainSc: 0-No pain         Complications: No apparent anesthesia complications

## 2020-03-07 NOTE — Anesthesia Preprocedure Evaluation (Signed)
Anesthesia Evaluation  Patient identified by MRN, date of birth, ID band Patient awake    Reviewed: Allergy & Precautions, NPO status , Patient's Chart, lab work & pertinent test results  History of Anesthesia Complications Negative for: history of anesthetic complications  Airway Mallampati: III  TM Distance: >3 FB Neck ROM: Full    Dental no notable dental hx. (+) Teeth Intact, Dental Advisory Given   Pulmonary neg pulmonary ROS, neg sleep apnea, neg COPD, Patient abstained from smoking.Not current smoker,  Childhood asthma    Pulmonary exam normal breath sounds clear to auscultation       Cardiovascular Exercise Tolerance: Good METShypertension, (-) CAD and (-) Past MI negative cardio ROS  (-) dysrhythmias  Rhythm:Regular Rate:Normal - Systolic murmurs    Neuro/Psych negative neurological ROS  negative psych ROS   GI/Hepatic GERD  Controlled,(+)     (-) substance abuse  ,   Endo/Other  diabetesMorbid obesityBorderline diabetes, no treatment  Renal/GU negative Renal ROS     Musculoskeletal   Abdominal (+) + obese,   Peds  Hematology   Anesthesia Other Findings Past Medical History: No date: Asthma     Comment:  childhood asthma No date: Diabetes mellitus without complication (HCC)     Comment:  borderline No date: GERD (gastroesophageal reflux disease) 12/12/2015: History of cesarean delivery No date: Hypertension     Comment:  no meds  Reproductive/Obstetrics                             Anesthesia Physical Anesthesia Plan  ASA: III  Anesthesia Plan: General   Post-op Pain Management:    Induction: Intravenous  PONV Risk Score and Plan: 4 or greater and Ondansetron and Dexamethasone  Airway Management Planned: LMA and Oral ETT  Additional Equipment: None  Intra-op Plan:   Post-operative Plan: Extubation in OR  Informed Consent: I have reviewed the patients  History and Physical, chart, labs and discussed the procedure including the risks, benefits and alternatives for the proposed anesthesia with the patient or authorized representative who has indicated his/her understanding and acceptance.     Dental advisory given  Plan Discussed with: CRNA and Surgeon  Anesthesia Plan Comments: (Discussed risks of anesthesia with patient, including PONV, sore throat, lip/dental damage. Rare risks discussed as well, such as cardiorespiratory and neurological sequelae. Patient understands.)        Anesthesia Quick Evaluation

## 2020-03-07 NOTE — Discharge Instructions (Signed)
Hysteroscopy, Care After This sheet gives you information about how to care for yourself after your procedure. Your health care provider may also give you more specific instructions. If you have problems or questions, contact your health care provider. What can I expect after the procedure? After the procedure, it is common to have:  Cramping.  Bleeding. This can vary from light spotting to menstrual-like bleeding. Follow these instructions at home: Activity  Rest for 1-2 days after the procedure.  Do not douche, use tampons, or have sex for 2 weeks after the procedure, or until your health care provider approves.  Do not drive for 24 hours after the procedure, or for as long as told by your health care provider.  Do not drive, use heavy machinery, or drink alcohol while taking prescription pain medicines. Medicines   Take over-the-counter and prescription medicines only as told by your health care provider.  Do not take aspirin during recovery. It can increase the risk of bleeding. General instructions  Do not take baths, swim, or use a hot tub until your health care provider approves. Take showers instead of baths for 2 weeks, or for as long as told by your health care provider.  To prevent or treat constipation while you are taking prescription pain medicine, your health care provider may recommend that you: ? Drink enough fluid to keep your urine clear or pale yellow. ? Take over-the-counter or prescription medicines. ? Eat foods that are high in fiber, such as fresh fruits and vegetables, whole grains, and beans. ? Limit foods that are high in fat and processed sugars, such as fried and sweet foods.  Keep all follow-up visits as told by your health care provider. This is important. Contact a health care provider if:  You feel dizzy or lightheaded.  You feel nauseous.  You have abnormal vaginal discharge.  You have a rash.  You have pain that does not get better with  medicine.  You have chills. Get help right away if:  You have bleeding that is heavier than a normal menstrual period.  You have a fever.  You have pain or cramps that get worse.  You develop new abdominal pain.  You faint.  You have pain in your shoulders.  You have shortness of breath. Summary  After the procedure, you may have cramping and some vaginal bleeding.  Do not douche, use tampons, or have sex for 2 weeks after the procedure, or until your health care provider approves.  Do not take baths, swim, or use a hot tub until your health care provider approves. Take showers instead of baths for 2 weeks, or for as long as told by your health care provider.  Report any unusual symptoms to your health care provider.  Keep all follow-up visits as told by your health care provider. This is important. This information is not intended to replace advice given to you by your health care provider. Make sure you discuss any questions you have with your health care provider. Document Revised: 10/24/2017 Document Reviewed: 12/10/2016 Elsevier Patient Education  2020 [employer]   AMBULATORY SURGERY  DISCHARGE INSTRUCTIONS   1) The drugs that you were given will stay in your system until tomorrow so for the next 24 hours you should not:  A) Drive an automobile B) Make any legal decisions C) Drink any alcoholic beverage   2) You may resume regular meals tomorrow.  Today it is better to start with liquids and gradually work up to   solid foods.  You may eat anything you prefer, but it is better to start with liquids, then soup and crackers, and gradually work up to solid foods.   3) Please notify your doctor immediately if you have any unusual bleeding, trouble breathing, redness and pain at the surgery site, drainage, fever, or pain not relieved by medication.    4) Additional Instructions:        Please contact your physician with any problems or Same Day Surgery  at [phone], Monday through Friday 6 am to 4 pm, or [place] [facility] number at [phone]. 

## 2020-03-07 NOTE — Op Note (Signed)
Operative Note  03/07/2020  PRE-OP DIAGNOSIS: Abnormal uterine bleeding  POST-OP DIAGNOSIS: Abnormal uterine bleeding, endometrial polyp  SURGEON: Adrian Prows MD  PROCEDURE: Procedure(s): DILATATION AND CURETTAGE, HYSTEROSCOPY WITH MYOSURE, POLYPECTOMY   ANESTHESIA: General   ESTIMATED BLOOD LOSS: 10 cc   SPECIMENS:  Endometrial Polyp, Endometrial curettings    FLUID DEFICIT: [id number] cc  COMPLICATIONS: None  DISPOSITION: PACU - hemodynamically stable.  CONDITION: stable  FINDINGS: Exam under anesthesia revealed  15 cm uterus with bilateral adnexa no masses or fullness. Hysteroscopy revealed normal uterine cavity with bilateral tubal ostia. Endometrial polyp in left lower quadrant of uterus. Normal appearing endocervical canal.  PROCEDURE IN DETAIL: After informed consent was obtained, the patient was taken to the operating room where anesthesia was obtained without difficulty. The patient was positioned in the dorsal lithotomy position in [place]. The patient's bladder was catheterized with an in and out foley catheter. The patient was examined under anesthesia, with the above noted findings. The weightedspeculum was placed inside the patient's vagina, and the the anterior lip of the cervix was seen and grasped with the tenaculum.  The uterine cavity was sounded to 15cm, and then the cervix was progressively dilated to a 17 French-Pratt dilator. The 0 degree hysteroscope was introduced, with saline fluid used to distend the intrauterine cavity, with the above noted findings. The hysteroscope was removed. The cavity was gently curetted until a gritty texture was noted, yielding endometrial curettings. Hysteroscopy was repeated. Curettage had not removed the endometrial polyp. The Myosure was used to remove the uterine polyp.  Once the cavity was sampled entirely the hysteroscope was removed. Excellent hemostasis was noted, and all instruments were removed, with excellent  hemostasis noted throughout. She was then taken out of dorsal lithotomy. Minimal discrepancy in fluid was noted.  The patient tolerated the procedure well. Sponge, lap and needle counts were correct x2. The patient was taken to recovery room in excellent condition.  Adrian Prows MD [facility], [facility] 03/07/2020 10:01 AM

## 2020-03-07 NOTE — Interval H&P Note (Signed)
History and Physical Interval Note:  03/07/2020 9:05 AM  Julie Patterson  has presented today for surgery, with the diagnosis of Abnormal uterine bleeding N93.9.  The various methods of treatment have been discussed with the patient and family. After consideration of risks, benefits and other options for treatment, the patient has consented to  Procedure(s): DILATATION AND CURETTAGE /HYSTEROSCOPY (N/A) as a surgical intervention.  The patient's history has been reviewed, patient examined, no change in status, stable for surgery.  I have reviewed the patient's chart and labs.  Questions were answered to the patient's satisfaction.     [place]

## 2020-03-08 LAB — SURGICAL PATHOLOGY

## 2020-03-13 ENCOUNTER — Encounter: Payer: Self-pay | Admitting: Obstetrics and Gynecology

## 2020-03-13 ENCOUNTER — Ambulatory Visit (INDEPENDENT_AMBULATORY_CARE_PROVIDER_SITE_OTHER): Payer: BC Managed Care – PPO | Admitting: Obstetrics and Gynecology

## 2020-03-13 ENCOUNTER — Other Ambulatory Visit: Payer: Self-pay

## 2020-03-13 VITALS — BP 132/82 | Ht 59.0 in | Wt 248.0 lb

## 2020-03-13 DIAGNOSIS — N939 Abnormal uterine and vaginal bleeding, unspecified: Secondary | ICD-10-CM

## 2020-03-13 DIAGNOSIS — D229 Melanocytic nevi, unspecified: Secondary | ICD-10-CM

## 2020-03-13 DIAGNOSIS — Z713 Dietary counseling and surveillance: Secondary | ICD-10-CM

## 2020-03-13 DIAGNOSIS — Z4889 Encounter for other specified surgical aftercare: Secondary | ICD-10-CM

## 2020-03-13 DIAGNOSIS — Z30011 Encounter for initial prescription of contraceptive pills: Secondary | ICD-10-CM

## 2020-03-13 MED ORDER — NORETHIN ACE-ETH ESTRAD-FE 1-20 MG-MCG PO TABS: 1.0000 | ORAL_TABLET | Freq: Every day | ORAL | 11 refills | Status: DC

## 2020-03-13 NOTE — Progress Notes (Signed)
Patient ID: Julie Patterson, female   DOB: Jul 15, 1986, 34 y.o.   MRN: [id number]  Reason for Consult: Post-op Follow-up (DILATATION AND CURETTAGE, HYSTEROSCOPY WITH MYOSURE, POLYPECTOMY)   Referred by [facility]  Subjective:     HPI:  Julie Patterson is a 34 y.o. female she has been doing well after her recent hysteroscopy D&C.  She reports that her pain and bleeding have almost completely resolved.  She is not planning on conceiving in the next year and is interested in starting an oral contraceptive pill for management of her irregular menstrual cycle.  Past Medical History:  Diagnosis Date  . Asthma    childhood asthma  . Diabetes mellitus without complication (HCC)    borderline  . GERD (gastroesophageal reflux disease)   . History of cesarean delivery 12/12/2015  . Hypertension    no meds   Family History  Problem Relation Age of Onset  . Diabetes Mother   . Diabetes Father   . Breast cancer Neg Hx   . Ovarian cancer Neg Hx    Past Surgical History:  Procedure Laterality Date  . APPENDECTOMY    . CESAREAN SECTION    . CESAREAN SECTION N/A 01/02/2016   Procedure: CESAREAN SECTION;  Surgeon: Will Bonnet, MD;  Location: [facility] ORS;  Service: Obstetrics;  Laterality: N/A;  . HYSTEROSCOPY WITH D & C N/A 03/07/2020   Procedure: DILATATION AND CURETTAGE, HYSTEROSCOPY WITH MYOSURE, POLYPECTOMY;  Surgeon: Homero Fellers, MD;  Location: [facility] ORS;  Service: Gynecology;  Laterality: N/A;    Short Social History:  Social History   Tobacco Use  . Smoking status: Never Smoker  . Smokeless tobacco: Never Used  Substance Use Topics  . Alcohol use: No    Allergies  Allergen Reactions  . Penicillins Hives    Did it involve swelling of the face/tongue/throat, SOB, or low BP? No Did it involve sudden or severe rash/hives, skin peeling, or any reaction on the inside of your mouth or nose? No Did you need to seek medical attention at a hospital or  doctor's office? No When did it last happen?2017 If all above answers are "NO", may proceed with cephalosporin use.     Current Outpatient Medications  Medication Sig Dispense Refill  . ELDERBERRY PO Take by mouth.    Marland Kitchen ibuprofen (ADVIL) 800 MG tablet Take 1 tablet (800 mg total) by mouth every 8 (eight) hours as needed for cramping. 30 tablet 0  . norethindrone-ethinyl estradiol (JUNEL FE 1/20) 1-20 MG-MCG tablet Take 1 tablet by mouth daily. 1 Package 11   No current facility-administered medications for this visit.    Review of Systems  Constitutional: Negative for chills, fatigue, fever and unexpected weight change.  HENT: Negative for trouble swallowing.  Eyes: Negative for loss of vision.  Respiratory: Negative for cough, shortness of breath and wheezing.  Cardiovascular: Negative for chest pain, leg swelling, palpitations and syncope.  GI: Negative for abdominal pain, blood in stool, diarrhea, nausea and vomiting.  GU: Negative for difficulty urinating, dysuria, frequency and hematuria.  Musculoskeletal: Negative for back pain, leg pain and joint pain.  Skin: Negative for rash.  Neurological: Negative for dizziness, headaches, light-headedness, numbness and seizures.  Psychiatric: Negative for behavioral problem, confusion, depressed mood and sleep disturbance.        Objective:  Objective   Vitals:   03/13/20 0906  BP: 132/82  Weight: 248 lb (112.5 kg)  Height: 4\' 11"  (1.499 m)   Body  mass index is 50.09 kg/m.  Physical Exam Vitals and nursing note reviewed.  Constitutional:      Appearance: She is well-developed.  HENT:     Head: Normocephalic and atraumatic.  Eyes:     Pupils: Pupils are equal, round, and reactive to light.  Cardiovascular:     Rate and Rhythm: Normal rate and regular rhythm.  Pulmonary:     Effort: Pulmonary effort is normal. No respiratory distress.  Skin:    General: Skin is warm and dry.  Neurological:     Mental Status: She is  alert and oriented to person, place, and time.  Psychiatric:        Behavior: Behavior normal.        Thought Content: Thought content normal.        Judgment: Judgment normal.        Assessment/Plan:     34 year old status post dilation curettage Doing well post op- mild cramps controlled with motrin, small smpotting Will initiate birth control pill. Will refer for further weight loss counseling.  Discussed healthy eating and exercise habits with the patient.  Discussed options for over-the-counter medications such as Alli, and consultation with my colleagues if she desires phentermine prescriptions to assist with weight loss. Atypical mole on right neck/shoulder.  Recommended dermatology referral which was placed.   More than 25 minutes were spent face to face with the patient in the room, reviewing the medical record, labs and images, and coordinating care for the patient. The plan of management was discussed in detail and counseling was provided.   Adrian Prows MD [facility], [facility] 03/13/2020 9:50 AM

## 2020-03-13 NOTE — Patient Instructions (Signed)
Exercising to Lose Weight Exercise is structured, repetitive physical activity to improve fitness and health. Getting regular exercise is important for everyone. It is especially important if you are overweight. Being overweight increases your risk of heart disease, stroke, diabetes, high blood pressure, and several types of cancer. Reducing your calorie intake and exercising can help you lose weight. Exercise is usually categorized as moderate or vigorous intensity. To lose weight, most people need to do a certain amount of moderate-intensity or vigorous-intensity exercise each week. Moderate-intensity exercise  Moderate-intensity exercise is any activity that gets you moving enough to burn at least three times more energy (calories) than if you were sitting. Examples of moderate exercise include:  Walking a mile in 15 minutes.  Doing light yard work.  Biking at an easy pace. Most people should get at least 150 minutes (2 hours and 30 minutes) a week of moderate-intensity exercise to maintain their body weight. Vigorous-intensity exercise Vigorous-intensity exercise is any activity that gets you moving enough to burn at least six times more calories than if you were sitting. When you exercise at this intensity, you should be working hard enough that you are not able to carry on a conversation. Examples of vigorous exercise include:  Running.  Playing a team sport, such as football, basketball, and soccer.  Jumping rope. Most people should get at least 75 minutes (1 hour and 15 minutes) a week of vigorous-intensity exercise to maintain their body weight. How can exercise affect me? When you exercise enough to burn more calories than you eat, you lose weight. Exercise also reduces body fat and builds muscle. The more muscle you have, the more calories you burn. Exercise also:  Improves mood.  Reduces stress and tension.  Improves your overall fitness, flexibility, and  endurance.  Increases bone strength. The amount of exercise you need to lose weight depends on:  Your age.  The type of exercise.  Any health conditions you have.  Your overall physical ability. Talk to your health care provider about how much exercise you need and what types of activities are safe for you. What actions can I take to lose weight? Nutrition   Make changes to your diet as told by your health care provider or diet and nutrition specialist (dietitian). This may include: ? Eating fewer calories. ? Eating more protein. ? Eating less unhealthy fats. ? Eating a diet that includes fresh fruits and vegetables, whole grains, low-fat dairy products, and lean protein. ? Avoiding foods with added fat, salt, and sugar.  Drink plenty of water while you exercise to prevent dehydration or heat stroke. Activity  Choose an activity that you enjoy and set realistic goals. Your health care provider can help you make an exercise plan that works for you.  Exercise at a moderate or vigorous intensity most days of the week. ? The intensity of exercise may vary from person to person. You can tell how intense a workout is for you by paying attention to your breathing and heartbeat. Most people will notice their breathing and heartbeat get faster with more intense exercise.  Do resistance training twice each week, such as: ? Push-ups. ? Sit-ups. ? Lifting weights. ? Using resistance bands.  Getting short amounts of exercise can be just as helpful as long structured periods of exercise. If you have trouble finding time to exercise, try to include exercise in your daily routine. ? Get up, stretch, and walk around every 30 minutes throughout the day. ? Go for a   walk during your lunch break. ? Park your car farther away from your destination. ? If you take public transportation, get off one stop early and walk the rest of the way. ? Make phone calls while standing up and walking  around. ? Take the stairs instead of elevators or escalators.  Wear comfortable clothes and shoes with good support.  Do not exercise so much that you hurt yourself, feel dizzy, or get very short of breath. Where to find more information  [place] Department of Health and Human Services: www.hhs.gov  Centers for Disease Control and Prevention (CDC): www.cdc.gov Contact a health care provider:  Before starting a new exercise program.  If you have questions or concerns about your weight.  If you have a medical problem that keeps you from exercising. Get help right away if you have any of the following while exercising:  Injury.  Dizziness.  Difficulty breathing or shortness of breath that does not go away when you stop exercising.  Chest pain.  Rapid heartbeat. Summary  Being overweight increases your risk of heart disease, stroke, diabetes, high blood pressure, and several types of cancer.  Losing weight happens when you burn more calories than you eat.  Reducing the amount of calories you eat in addition to getting regular moderate or vigorous exercise each week helps you lose weight. This information is not intended to replace advice given to you by your health care provider. Make sure you discuss any questions you have with your health care provider. Document Revised: 11/24/2017 Document Reviewed: 11/24/2017 Elsevier Patient Education  2020 [employer]. Calorie Counting for Weight Loss Calories are units of energy. Your body needs a certain amount of calories from food to keep you going throughout the day. When you eat more calories than your body needs, your body stores the extra calories as fat. When you eat fewer calories than your body needs, your body burns fat to get the energy it needs. Calorie counting means keeping track of how many calories you eat and drink each day. Calorie counting can be helpful if you need to lose weight. If you make sure to eat fewer calories  than your body needs, you should lose weight. Ask your health care provider what a healthy weight is for you. For calorie counting to work, you will need to eat the right number of calories in a day in order to lose a healthy amount of weight per week. A dietitian can help you determine how many calories you need in a day and will give you suggestions on how to reach your calorie goal.  A healthy amount of weight to lose per week is usually 1-2 lb (0.5-0.9 kg). This usually means that your daily calorie intake should be reduced by 500-750 calories.  Eating 1,200 - 1,500 calories per day can help most women lose weight.  Eating 1,500 - 1,800 calories per day can help most men lose weight. What is my plan? My goal is to have __________ calories per day. If I have this many calories per day, I should lose around __________ pounds per week. What do I need to know about calorie counting? In order to meet your daily calorie goal, you will need to:  Find out how many calories are in each food you would like to eat. Try to do this before you eat.  Decide how much of the food you plan to eat.  Write down what you ate and how many calories it had. Doing this   is called keeping a food log. To successfully lose weight, it is important to balance calorie counting with a healthy lifestyle that includes regular activity. Aim for 150 minutes of moderate exercise (such as walking) or 75 minutes of vigorous exercise (such as running) each week. Where do I find calorie information?  The number of calories in a food can be found on a Nutrition Facts label. If a food does not have a Nutrition Facts label, try to look up the calories online or ask your dietitian for help. Remember that calories are listed per serving. If you choose to have more than one serving of a food, you will have to multiply the calories per serving by the amount of servings you plan to eat. For example, the label on a package of bread might  say that a serving size is 1 slice and that there are 90 calories in a serving. If you eat 1 slice, you will have eaten 90 calories. If you eat 2 slices, you will have eaten 180 calories. How do I keep a food log? Immediately after each meal, record the following information in your food log:  What you ate. Don't forget to include toppings, sauces, and other extras on the food.  How much you ate. This can be measured in cups, ounces, or number of items.  How many calories each food and drink had.  The total number of calories in the meal. Keep your food log near you, such as in a small notebook in your pocket, or use a mobile app or website. Some programs will calculate calories for you and show you how many calories you have left for the day to meet your goal. What are some calorie counting tips?   Use your calories on foods and drinks that will fill you up and not leave you hungry: ? Some examples of foods that fill you up are nuts and nut butters, vegetables, lean proteins, and high-fiber foods like whole grains. High-fiber foods are foods with more than 5 g fiber per serving. ? Drinks such as sodas, specialty coffee drinks, alcohol, and juices have a lot of calories, yet do not fill you up.  Eat nutritious foods and avoid empty calories. Empty calories are calories you get from foods or beverages that do not have many vitamins or protein, such as candy, sweets, and soda. It is better to have a nutritious high-calorie food (such as an avocado) than a food with few nutrients (such as a bag of chips).  Know how many calories are in the foods you eat most often. This will help you calculate calorie counts faster.  Pay attention to calories in drinks. Low-calorie drinks include water and unsweetened drinks.  Pay attention to nutrition labels for "low fat" or "fat free" foods. These foods sometimes have the same amount of calories or more calories than the full fat versions. They also often  have added sugar, starch, or salt, to make up for flavor that was removed with the fat.  Find a way of tracking calories that works for you. Get creative. Try different apps or programs if writing down calories does not work for you. What are some portion control tips?  Know how many calories are in a serving. This will help you know how many servings of a certain food you can have.  Use a measuring cup to measure serving sizes. You could also try weighing out portions on a kitchen scale. With time, you will be able   to estimate serving sizes for some foods.  Take some time to put servings of different foods on your favorite plates, bowls, and cups so you know what a serving looks like.  Try not to eat straight from a bag or box. Doing this can lead to overeating. Put the amount you would like to eat in a cup or on a plate to make sure you are eating the right portion.  Use smaller plates, glasses, and bowls to prevent overeating.  Try not to multitask (for example, watch TV or use your computer) while eating. If it is time to eat, sit down at a table and enjoy your food. This will help you to know when you are full. It will also help you to be aware of what you are eating and how much you are eating. What are tips for following this plan? Reading food labels  Check the calorie count compared to the serving size. The serving size may be smaller than what you are used to eating.  Check the source of the calories. Make sure the food you are eating is high in vitamins and protein and low in saturated and trans fats. Shopping  Read nutrition labels while you shop. This will help you make healthy decisions before you decide to purchase your food.  Make a grocery list and stick to it. Cooking  Try to cook your favorite foods in a healthier way. For example, try baking instead of frying.  Use low-fat dairy products. Meal planning  Use more fruits and vegetables. Half of your plate should be  fruits and vegetables.  Include lean proteins like poultry and fish. How do I count calories when eating out?  Ask for smaller portion sizes.  Consider sharing an entree and sides instead of getting your own entree.  If you get your own entree, eat only half. Ask for a box at the beginning of your meal and put the rest of your entree in it so you are not tempted to eat it.  If calories are listed on the menu, choose the lower calorie options.  Choose dishes that include vegetables, fruits, whole grains, low-fat dairy products, and lean protein.  Choose items that are boiled, broiled, grilled, or steamed. Stay away from items that are buttered, battered, fried, or served with cream sauce. Items labeled "crispy" are usually fried, unless stated otherwise.  Choose water, low-fat milk, unsweetened iced tea, or other drinks without added sugar. If you want an alcoholic beverage, choose a lower calorie option such as a glass of wine or light beer.  Ask for dressings, sauces, and syrups on the side. These are usually high in calories, so you should limit the amount you eat.  If you want a salad, choose a garden salad and ask for grilled meats. Avoid extra toppings like bacon, cheese, or fried items. Ask for the dressing on the side, or ask for olive oil and vinegar or lemon to use as dressing.  Estimate how many servings of a food you are given. For example, a serving of cooked rice is  cup or about the size of half a baseball. Knowing serving sizes will help you be aware of how much food you are eating at restaurants. The list below tells you how big or small some common portion sizes are based on everyday objects: ? 1 oz--4 stacked dice. ? 3 oz--1 deck of cards. ? 1 tsp--1 die. ? 1 Tbsp-- a ping-pong ball. ? 2 Tbsp--1 ping-pong ball. ?    cup-- baseball. ? 1 cup--1 baseball. Summary  Calorie counting means keeping track of how many calories you eat and drink each day. If you eat fewer  calories than your body needs, you should lose weight.  A healthy amount of weight to lose per week is usually 1-2 lb (0.5-0.9 kg). This usually means reducing your daily calorie intake by 500-750 calories.  The number of calories in a food can be found on a Nutrition Facts label. If a food does not have a Nutrition Facts label, try to look up the calories online or ask your dietitian for help.  Use your calories on foods and drinks that will fill you up, and not on foods and drinks that will leave you hungry.  Use smaller plates, glasses, and bowls to prevent overeating. This information is not intended to replace advice given to you by your health care provider. Make sure you discuss any questions you have with your health care provider. Document Revised: 07/31/2018 Document Reviewed: 10/11/2016 Elsevier Patient Education  2020 Elsevier Inc.  

## 2020-04-04 ENCOUNTER — Other Ambulatory Visit: Payer: Self-pay | Admitting: Obstetrics and Gynecology

## 2020-04-04 DIAGNOSIS — Z30011 Encounter for initial prescription of contraceptive pills: Secondary | ICD-10-CM

## 2020-04-04 MED ORDER — NORETHIN ACE-ETH ESTRAD-FE 1.5-30 MG-MCG PO TABS: 1.0000 | ORAL_TABLET | Freq: Every day | ORAL | 11 refills | Status: DC

## 2020-04-06 ENCOUNTER — Ambulatory Visit: Payer: BC Managed Care – PPO | Admitting: Dietician

## 2020-05-16 ENCOUNTER — Other Ambulatory Visit: Payer: Self-pay | Admitting: Obstetrics and Gynecology

## 2020-05-16 DIAGNOSIS — Z30011 Encounter for initial prescription of contraceptive pills: Secondary | ICD-10-CM

## 2020-05-16 MED ORDER — ALYACEN 1/35 1-35 MG-MCG PO TABS: 1.0000 | ORAL_TABLET | Freq: Every day | ORAL | 11 refills | Status: DC

## 2020-06-08 ENCOUNTER — Ambulatory Visit: Payer: BC Managed Care – PPO | Admitting: Dermatology

## 2020-06-08 ENCOUNTER — Other Ambulatory Visit: Payer: Self-pay

## 2020-06-08 DIAGNOSIS — D224 Melanocytic nevi of scalp and neck: Secondary | ICD-10-CM

## 2020-06-08 DIAGNOSIS — D485 Neoplasm of uncertain behavior of skin: Secondary | ICD-10-CM

## 2020-06-08 NOTE — Progress Notes (Signed)
   New Patient Visit  Subjective  Julie Patterson is a 34 y.o. female who presents for the following: lesion (painful at times, irritated and rubs on clothing). On the R neck.  The following portions of the chart were reviewed this encounter and updated as appropriate:  Tobacco  Allergies  Meds  Problems  Med Hx  Surg Hx  Fam Hx     Review of Systems:  No other skin or systemic complaints except as noted in HPI or Assessment and Plan.  Objective  Well appearing patient in no apparent distress; mood and affect are within normal limits.  A focused examination was performed including the right neck. Relevant physical exam findings are noted in the Assessment and Plan.  Objective  R neck: Waxy keratotic papule 1.4 cm   Assessment & Plan    Neoplasm of uncertain behavior of skin R neck  Epidermal / dermal shaving  Lesion diameter (cm):  1.4 Informed consent: discussed and consent obtained   Timeout: patient name, date of birth, surgical site, and procedure verified   Procedure prep:  Patient was prepped and draped in usual sterile fashion Prep type:  Isopropyl alcohol Anesthesia: the lesion was anesthetized in a standard fashion   Anesthetic:  1% lidocaine w/ epinephrine 1-100,000 buffered w/ 8.4% NaHCO3 Instrument used: flexible razor blade   Hemostasis achieved with: pressure, aluminum chloride and electrodesiccation   Outcome: patient tolerated procedure well   Post-procedure details: sterile dressing applied and wound care instructions given   Dressing type: bandage and petrolatum   Additional details:  Post tx defect 1.8 cm   Specimen 1 - Surgical pathology Differential Diagnosis: D48.5 ISK r/o dysplastic nevus Check Margins: No  Return if symptoms worsen or fail to improve.  Luther Redo, CMA, am acting as scribe for Sarina Ser, MD .  Documentation: I have reviewed the above documentation for accuracy and completeness, and I agree with the  above.  Sarina Ser, MD

## 2020-06-08 NOTE — Patient Instructions (Signed)

## 2020-06-11 ENCOUNTER — Encounter: Payer: Self-pay | Admitting: Dermatology

## 2020-06-13 ENCOUNTER — Telehealth: Payer: Self-pay

## 2020-06-13 NOTE — Telephone Encounter (Signed)
Patient called and informed of biopsy results, patient verbalized understanding.  

## 2021-12-26 ENCOUNTER — Other Ambulatory Visit: Payer: Self-pay

## 2021-12-26 ENCOUNTER — Ambulatory Visit
Admission: EM | Admit: 2021-12-26 | Discharge: 2021-12-26 | Disposition: A | Discharge status: Home / Self Care | Payer: BC Managed Care – PPO | Attending: Emergency Medicine | Admitting: Emergency Medicine

## 2021-12-26 DIAGNOSIS — J029 Acute pharyngitis, unspecified: Secondary | ICD-10-CM | POA: Diagnosis present

## 2021-12-26 LAB — PREGNANCY, URINE: Preg Test, Ur: NEGATIVE

## 2021-12-26 LAB — GROUP A STREP BY PCR: Group A Strep by PCR: NOT DETECTED

## 2021-12-26 MED ORDER — ACETAMINOPHEN 325 MG PO TABS
650.0000 mg | ORAL_TABLET | Freq: Once | ORAL | Status: AC
  Administered 2021-12-26: 650 mg via ORAL

## 2021-12-26 MED ORDER — AZITHROMYCIN 250 MG PO TABS: 250.0000 mg | ORAL_TABLET | Freq: Every day | ORAL | 0 refills | Status: DC

## 2021-12-26 MED ORDER — LIDOCAINE VISCOUS HCL 2 % MT SOLN: 15.0000 mL | OROMUCOSAL | 0 refills | Status: AC | PRN

## 2021-12-26 NOTE — ED Triage Notes (Signed)
Pt reports sore throat x 2 days

## 2021-12-26 NOTE — Discharge Instructions (Addendum)
Take the azithromycin daily for 5 days for treatment of your strep throat.  Gargle with warm salt water 2-3 times a day to soothe your throat, aid in pain relief, and aid in healing.  Take over-the-counter ibuprofen according to the package instructions as needed for pain.  You can also use Chloraseptic or Sucrets lozenges, 1 lozenge every 2 hours as needed for throat pain.  Use the viscous lidocaine, 15 mL (1 tablespoon) every 6 hours as needed for sore throat pain.  If you develop any new or worsening symptoms return for reevaluation.

## 2021-12-26 NOTE — ED Provider Notes (Signed)
[facility]    CSN: [id number] Arrival date & time: 12/26/21  1900      History   Chief Complaint Chief Complaint  Patient presents with   Sore Throat    HPI Julie Patterson is a 36 y.o. female.   HPI  36 year old female here for evaluation of sore throat.  Patient ports that she has been experiencing sore throat for the past 2 days.  She denies any runny nose or nasal congestion, cough, shortness breath or wheezing, GI complaints, or known sick contacts.  She had not been running a fever until she arrived in clinic and her temperature here was 101.1.  She also endorses some ear pain that started just after her arrival as well.  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   BMI 40.0-44.9, adult ([place]) 01/02/2016   Status post cesarean section 01/02/2016   History of cesarean delivery 12/12/2015    Past Surgical History:  Procedure Laterality Date   APPENDECTOMY     CESAREAN SECTION     CESAREAN SECTION N/A 01/02/2016   Procedure: CESAREAN SECTION;  Surgeon: Will Bonnet, MD;  Location: [facility];  Service: Obstetrics;  Laterality: N/A;   HYSTEROSCOPY WITH D & C N/A 03/07/2020   Procedure: DILATATION AND CURETTAGE, HYSTEROSCOPY WITH MYOSURE, POLYPECTOMY;  Surgeon: Homero Fellers, MD;  Location: [facility];  Service: Gynecology;  Laterality: N/A;    OB History     Gravida  2   Para  2   Term  2   Preterm      AB      Living  2      SAB      IAB      Ectopic      Multiple  0   Live Births  2            Home  Medications    Prior to Admission medications   Medication Sig Start Date End Date Taking? Authorizing Provider  azithromycin (ZITHROMAX Z-PAK) 250 MG tablet Take 1 tablet (250 mg total) by mouth daily. Take 2 tablets on the first day and then 1 tablet daily thereafter for a total of 5 days of treatment. 12/26/21  Yes Margarette Canada, NP  lidocaine (XYLOCAINE) 2 % solution Use as directed 15 mLs in the mouth or throat as needed for mouth pain. 12/26/21  Yes Margarette Canada, NP  ELDERBERRY PO Take by mouth.    [provider]  ibuprofen (ADVIL) 800 MG tablet Take 1 tablet (800 mg total) by mouth every 8 (eight) hours as needed for cramping. 03/07/20   Homero Fellers, MD  norethindrone-ethinyl estradiol 1/35 (ALAYCEN 1/35) tablet Take 1 tablet by mouth daily. 05/16/20   Schuman, Stefanie Libel, MD  norethindrone-ethinyl estradiol-iron (LOESTRIN FE 1.5/30) 1.5-30 MG-MCG tablet Take 1 tablet by mouth daily. 04/04/20   Homero Fellers, MD    Family History Family History  Problem Relation Age of Onset   Diabetes Mother    Diabetes Father  Breast cancer Neg Hx    Ovarian cancer Neg Hx     Social History Social History   Tobacco Use   Smoking status: Never   Smokeless tobacco: Never  Vaping Use   Vaping Use: Never used  Substance Use Topics   Alcohol use: No   Drug use: No     Allergies   Penicillins   Review of Systems Review of Systems  Constitutional:  Positive for fever.  HENT:  Positive for ear pain and sore throat. Negative for congestion and rhinorrhea.   Respiratory:  Negative for cough, shortness of breath and wheezing.   Gastrointestinal:  Negative for diarrhea, nausea and vomiting.  Skin:  Negative for rash.  Hematological: Negative.   Psychiatric/Behavioral: Negative.      Physical Exam Triage Vital Signs ED Triage Vitals [12/26/21 1909]  Enc Vitals Group     BP (!) 147/90     Pulse Rate 86     Resp 18     Temp (!) 101.1 F (38.4 C)     Temp  Source Oral     SpO2 99 %     Weight      Height      Head Circumference      Peak Flow      Pain Score 10     Pain Loc      Pain Edu?      Excl. in [place]?    No data found.  Updated Vital Signs BP (!) 147/90 (BP Location: Right Arm)    Pulse 86    Temp (!) 101.1 F (38.4 C) (Oral)    Resp 18    LMP  (Within Months) Comment: September 2022   SpO2 99%   Visual Acuity Right Eye Distance:   Left Eye Distance:   Bilateral Distance:    Right Eye Near:   Left Eye Near:    Bilateral Near:     Physical Exam Vitals and nursing note reviewed.  Constitutional:      General: She is not in acute distress.    Appearance: Normal appearance. She is not ill-appearing.  HENT:     Head: Normocephalic and atraumatic.     Right Ear: Tympanic membrane, ear canal and external ear normal. There is no impacted cerumen.     Left Ear: Tympanic membrane, ear canal and external ear normal. There is no impacted cerumen.     Nose: Nose normal. No congestion or rhinorrhea.     Mouth/Throat:     Mouth: Mucous membranes are moist.     Pharynx: Oropharyngeal exudate and posterior oropharyngeal erythema present.  Cardiovascular:     Rate and Rhythm: Normal rate and regular rhythm.     Pulses: Normal pulses.     Heart sounds: Normal heart sounds. No murmur heard.   No friction rub. No gallop.  Pulmonary:     Effort: Pulmonary effort is normal.     Breath sounds: Normal breath sounds. No wheezing, rhonchi or rales.  Musculoskeletal:     Cervical back: Normal range of motion and neck supple.  Lymphadenopathy:     Cervical: Cervical adenopathy present.  Skin:    General: Skin is warm and dry.     Capillary Refill: Capillary refill takes less than 2 seconds.     Findings: No erythema or rash.  Neurological:     General: No focal deficit present.     Mental Status: She is alert and oriented to person, place, and time.  Psychiatric:        Mood and Affect: Mood normal.        Behavior: Behavior normal.         Thought Content: Thought content normal.        Judgment: Judgment normal.     UC Treatments / Results  Labs (all labs ordered are listed, but only abnormal results are displayed) Labs Reviewed  GROUP A STREP BY PCR  PREGNANCY, URINE    EKG   Radiology No results found.  Procedures Procedures (including critical care time)  Medications Ordered in UC Medications  acetaminophen (TYLENOL) tablet 650 mg (650 mg Oral Given 12/26/21 1914)    Initial Impression / Assessment and Plan / UC Course  I have reviewed the triage vital signs and the nursing notes.  Pertinent labs & imaging results that were available during my care of the patient were reviewed by me and considered in my medical decision making (see chart for details).  Patient is a pleasant, nontoxic-appearing 36 year old female here for evaluation of 2 days worth of sore throat that culminated in a fever and bilateral ear pain this evening.  No other upper or lower respiratory symptoms expressed by the patient.  No GI symptoms.  Patient's physical exam reveals pearly gray tympanic membranes bilaterally with normal light reflex and clear external auditory canals.  Nasal mucosa is pink and moist without erythema, edema, or discharge.  Oropharyngeal exam reveals marked erythema and edema to bilateral tonsillar pillars with white exudate in the posterior oropharynx.  Patient does have bilateral anterior cervical adenopathy on exam.  Cardiopulmonary dam feels clear lung sounds in all fields.  Strep PCR was collected in triage.  Urine was also sent to check for pregnancy test as patient has irregular periods that she has not had a menstrual cycle since September.  Pregnancy test is negative.  Analyzer unable to run strep PCR.  Will treat patient empirically for strep throat with azithromycin.   Final Clinical Impressions(s) / UC Diagnoses   Final diagnoses:  Pharyngitis, unspecified etiology     Discharge Instructions       Take the azithromycin daily for 5 days for treatment of your strep throat.  Gargle with warm salt water 2-3 times a day to soothe your throat, aid in pain relief, and aid in healing.  Take over-the-counter ibuprofen according to the package instructions as needed for pain.  You can also use Chloraseptic or Sucrets lozenges, 1 lozenge every 2 hours as needed for throat pain.  Use the viscous lidocaine, 15 mL (1 tablespoon) every 6 hours as needed for sore throat pain.  If you develop any new or worsening symptoms return for reevaluation.      ED Prescriptions     Medication Sig Dispense Auth. Provider   azithromycin (ZITHROMAX Z-PAK) 250 MG tablet Take 1 tablet (250 mg total) by mouth daily. Take 2 tablets on the first day and then 1 tablet daily thereafter for a total of 5 days of treatment. 6 tablet Margarette Canada, NP   lidocaine (XYLOCAINE) 2 % solution Use as directed 15 mLs in the mouth or throat as needed for mouth pain. 100 mL Margarette Canada, NP      PDMP not reviewed this encounter.   Margarette Canada, NP 12/26/21 1958

## 2023-12-28 ENCOUNTER — Encounter: Payer: Self-pay | Admitting: Emergency Medicine

## 2023-12-28 ENCOUNTER — Ambulatory Visit
Admission: EM | Admit: 2023-12-28 | Discharge: 2023-12-28 | Disposition: A | Discharge status: Home / Self Care | Payer: Medicaid Other | Attending: Emergency Medicine | Admitting: Emergency Medicine

## 2023-12-28 DIAGNOSIS — J45901 Unspecified asthma with (acute) exacerbation: Secondary | ICD-10-CM | POA: Diagnosis present

## 2023-12-28 DIAGNOSIS — J101 Influenza due to other identified influenza virus with other respiratory manifestations: Secondary | ICD-10-CM | POA: Diagnosis present

## 2023-12-28 DIAGNOSIS — Z20822 Contact with and (suspected) exposure to covid-19: Secondary | ICD-10-CM

## 2023-12-28 DIAGNOSIS — J069 Acute upper respiratory infection, unspecified: Secondary | ICD-10-CM | POA: Diagnosis present

## 2023-12-28 LAB — RESP PANEL BY RT-PCR (FLU A&B, COVID) ARPGX2
Influenza A by PCR: POSITIVE — AB
Influenza B by PCR: NEGATIVE
SARS Coronavirus 2 by RT PCR: NEGATIVE

## 2023-12-28 MED ORDER — FLUTICASONE PROPIONATE 50 MCG/ACT NA SUSP: 2.0000 | Freq: Every day | NASAL | 0 refills | Status: AC

## 2023-12-28 MED ORDER — ALBUTEROL SULFATE HFA 108 (90 BASE) MCG/ACT IN AERS: 1.0000 | INHALATION_SPRAY | RESPIRATORY_TRACT | 0 refills | Status: AC | PRN

## 2023-12-28 MED ORDER — PROMETHAZINE-DM 6.25-15 MG/5ML PO SYRP: 5.0000 mL | ORAL_SOLUTION | Freq: Four times a day (QID) | ORAL | 0 refills | Status: AC | PRN

## 2023-12-28 MED ORDER — OSELTAMIVIR PHOSPHATE 75 MG PO CAPS: 75.0000 mg | ORAL_CAPSULE | Freq: Two times a day (BID) | ORAL | 0 refills | Status: AC

## 2023-12-28 MED ORDER — AEROCHAMBER MV MISC: 1 refills | Status: AC

## 2023-12-28 MED ORDER — IBUPROFEN 600 MG PO TABS: 600.0000 mg | ORAL_TABLET | Freq: Three times a day (TID) | ORAL | 0 refills | Status: AC | PRN

## 2023-12-28 MED ORDER — PREDNISONE 20 MG PO TABS: 40.0000 mg | ORAL_TABLET | Freq: Every day | ORAL | 0 refills | Status: AC

## 2023-12-28 NOTE — Discharge Instructions (Addendum)
Influenza A is positive.  I am sending home with Tamiflu.  Finish it even if you feel better.   Take two puffs from your albuterol inhaler every 4 hours for 2 days, then every 6 hours for 2 days, then as needed. You can back off if you start to improve  sooner. Finish the steroids unless your doctor tells you to stop.   take tylenol 1 gram combined with  600 mg of motrin up to 3-4 times a day as needed for pain.   is an effective combination for pain and fever. Make sure you drink extra fluids. Return if you get worse, have a fever >100.4, or any other concerns.   If the spacer is too expensive at the pharmacy, you can get an AeroChamber Z-Stat off of [employer] for about $10-$15.  Go to www.goodrx.com  or www.costplusdrugs.com to look up your medications. This will give you a list of where you can find your prescriptions at the most affordable prices. Or ask the pharmacist what the cash price is, or if they have any other discount programs available to help make your medication more affordable. This can be less expensive than what you would pay with insurance.

## 2023-12-28 NOTE — ED Provider Notes (Signed)
HPI  SUBJECTIVE:  Julie Patterson is a 38 y.o. female who presents with 2 days of dry cough, nasal congestion, chills, wheezing, body aches, headaches.  No fevers, rhinorrhea, sinus pain and pressure, postnasal drip, sore throat, shortness of breath, dyspnea on exertion, nausea, vomiting, diarrhea, abdominal pain.  She was exposed to COVID earlier this week at work.  No known flu exposure.  She got 1 COVID-vaccine.  She did not get this years flu vaccine.  She is unable to sleep at night because of the cough.  No antibiotics in the past month.  No antipyretics in the past 6 hours.  She tried rest with improvement in her symptoms.  No aggravating factors. Past medical history of asthma as a child with no admissions, prediabetes, GERD, hypertension but does not require medications.  LMP: Beginning of January.  Denies the possibility of being pregnant.  [facility]  Past Medical History:  Diagnosis Date   Asthma    childhood asthma   Diabetes mellitus without complication ([facility])    borderline   GERD (gastroesophageal reflux disease)    History of cesarean delivery 12/12/2015   Hypertension    no meds    Past Surgical History:  Procedure Laterality Date   APPENDECTOMY     CESAREAN SECTION     CESAREAN SECTION N/A 01/02/2016   Procedure: CESAREAN SECTION;  Surgeon: Conard Novak, MD;  Location: [facility];  Service: Obstetrics;  Laterality: N/A;   HYSTEROSCOPY WITH D & C N/A 03/07/2020   Procedure: DILATATION AND CURETTAGE, HYSTEROSCOPY WITH MYOSURE, POLYPECTOMY;  Surgeon: Natale Milch, MD;  Location: [facility];  Service: Gynecology;  Laterality: N/A;    Family History  Problem Relation Age of Onset   Diabetes Mother    Diabetes Father    Breast cancer Neg Hx    Ovarian cancer Neg Hx     Social History   Tobacco Use   Smoking status: Never   Smokeless tobacco: Never  Vaping Use   Vaping status: Never Used  Substance Use Topics   Alcohol use: No   Drug use: No     No current facility-administered medications for this encounter.  Current Outpatient Medications:    albuterol (VENTOLIN HFA) 108 (90 Base) MCG/ACT inhaler, Inhale 1-2 puffs into the lungs every 4 (four) hours as needed for wheezing or shortness of breath., Disp: 1 each, Rfl: 0   fluticasone (FLONASE) 50 MCG/ACT nasal spray, Place 2 sprays into both nostrils daily., Disp: 16 g, Rfl: 0   ibuprofen (ADVIL) 600 MG tablet, Take 1 tablet (600 mg total) by mouth every 8 (eight) hours as needed., Disp: 30 tablet, Rfl: 0   metFORMIN (GLUCOPHAGE) 500 MG tablet, Take 500 mg by mouth 2 (two) times daily with a meal., Disp: , Rfl:    oseltamivir (TAMIFLU) 75 MG capsule, Take 1 capsule (75 mg total) by mouth 2 (two) times daily. X 5 days, Disp: 10 capsule, Rfl: 0   predniSONE (DELTASONE) 20 MG tablet, Take 2 tablets (40 mg total) by mouth daily with breakfast for 5 days., Disp: 10 tablet, Rfl: 0   promethazine-dextromethorphan (PROMETHAZINE-DM) 6.25-15 MG/5ML syrup, Take 5 mLs by mouth 4 (four) times daily as needed for cough., Disp: 118 mL, Rfl: 0   Spacer/Aero-Holding Chambers (AEROCHAMBER MV) inhaler, Use as instructed, Disp: 1 each, Rfl: 1   ELDERBERRY PO, Take by mouth., Disp: , Rfl:    lidocaine (XYLOCAINE) 2 % solution, Use as directed 15 mLs in the mouth  or throat as needed for mouth pain., Disp: 100 mL, Rfl: 0   NIFEdipine (PROCARDIA-XL/NIFEDICAL-XL) 30 MG 24 hr tablet, Take 30 mg by mouth daily., Disp: , Rfl:   Allergies  Allergen Reactions   Penicillins Hives    Did it involve swelling of the face/tongue/throat, SOB, or low BP? No Did it involve sudden or severe rash/hives, skin peeling, or any reaction on the inside of your mouth or nose? No Did you need to seek medical attention at a hospital or doctor's office? No When did it last happen? 2017  If all above answers are "NO", may proceed with cephalosporin use.      ROS  As noted in HPI.   Physical Exam  BP (!) 144/90 (BP  Location: Left Arm)   Pulse 100   Temp 99.8 F (37.7 C) (Oral)   Resp 14   Ht 4\' 11"  (1.499 m)   Wt 108.9 kg   LMP 12/02/2023 (Approximate)   SpO2 97%   BMI 48.47 kg/m   Constitutional: Well developed, well nourished, no acute distress Eyes:  EOMI, conjunctiva normal bilaterally HENT: Normocephalic, atraumatic,mucus membranes moist.  Purulent nasal congestion.  Swollen, erythematous turbinates.  No maxillary, frontal sinus tenderness.  Unable to completely visualize oropharynx. Neck: Positive cervical lymphadenopathy Respiratory: Normal inspiratory effort Cardiovascular: Regular tachycardia, no murmurs rubs or gallops no chest wall tenderness. GI: nondistended skin: No rash, skin intact Musculoskeletal: no deformities Neurologic: Alert & oriented x 3, no focal neuro deficits Psychiatric: Speech and behavior appropriate   ED Course   Medications - No data to display  Orders Placed This Encounter  Procedures   Resp Panel by RT-PCR (Flu A&B, Covid) Anterior Nasal Swab    Standing Status:   Standing    Number of Occurrences:   1    Results for orders placed or performed during the hospital encounter of 12/28/23 (from the past 24 hours)  Resp Panel by RT-PCR (Flu A&B, Covid) Anterior Nasal Swab     Status: Abnormal   Collection Time: 12/28/23  8:29 AM   Specimen: Anterior Nasal Swab  Result Value Ref Range   SARS Coronavirus 2 by RT PCR NEGATIVE NEGATIVE   Influenza A by PCR POSITIVE (A) NEGATIVE   Influenza B by PCR NEGATIVE NEGATIVE   No results found.  ED Clinical Impression  1. Influenza A   2. Viral URI with cough   3. Encounter for laboratory testing for COVID-19 virus   4. Exposure to confirmed case of COVID-19   5. Moderate asthma with acute exacerbation, unspecified whether persistent      ED Assessment/Plan    Patient presents with acute illness with systemic symptoms of tachycardia.  GFR on labs from 12/24 97 milliliters/minute.  Checking COVID,  flu.  Will prescribe the appropriate antivirals if either one of them are positive.  [id number].  Influenza A positive.  COVID-negative.  Patient presents with influenza A.  I am also concerned that she is having an asthma exacerbation.  Home with regularly scheduled albuterol inhaler with a spacer for 4 days, then as needed thereafter, Promethazine DM, Tylenol/ibuprofen 3 times daily, Flonase, prednisone 40 mg for 5 days, saline nasal irrigation, Flonase.  Work note for tomorrow.  Follow-up with PCP as needed.  Discussed labs,  MDM, treatment plan, and plan for follow-up with patient.. patient agrees with plan.   Meds ordered this encounter  Medications   fluticasone (FLONASE) 50 MCG/ACT nasal spray    Sig: Place 2 sprays into both  nostrils daily.    Dispense:  16 g    Refill:  0   albuterol (VENTOLIN HFA) 108 (90 Base) MCG/ACT inhaler    Sig: Inhale 1-2 puffs into the lungs every 4 (four) hours as needed for wheezing or shortness of breath.    Dispense:  1 each    Refill:  0   ibuprofen (ADVIL) 600 MG tablet    Sig: Take 1 tablet (600 mg total) by mouth every 8 (eight) hours as needed.    Dispense:  30 tablet    Refill:  0   Spacer/Aero-Holding Chambers (AEROCHAMBER MV) inhaler    Sig: Use as instructed    Dispense:  1 each    Refill:  1   promethazine-dextromethorphan (PROMETHAZINE-DM) 6.25-15 MG/5ML syrup    Sig: Take 5 mLs by mouth 4 (four) times daily as needed for cough.    Dispense:  118 mL    Refill:  0   oseltamivir (TAMIFLU) 75 MG capsule    Sig: Take 1 capsule (75 mg total) by mouth 2 (two) times daily. X 5 days    Dispense:  10 capsule    Refill:  0   predniSONE (DELTASONE) 20 MG tablet    Sig: Take 2 tablets (40 mg total) by mouth daily with breakfast for 5 days.    Dispense:  10 tablet    Refill:  0      *This clinic note was created using Scientist, clinical (histocompatibility and immunogenetics). Therefore, there may be occasional mistakes despite careful proofreading.  ?     Domenick Gong, MD 12/29/23 [phone]

## 2023-12-28 NOTE — ED Triage Notes (Signed)
Patient c/o cough and chest congestion that started on Friday.  Patient unsure of fevers.

## 2023-12-29 ENCOUNTER — Ambulatory Visit: Payer: Self-pay
